# Patient Record
Sex: Male | Born: 1950 | Race: White | Hispanic: No | Smoking: Former smoker
Health system: Southern US, Community
[De-identification: ages and names within clinical notes are randomized; demographics above are authoritative.]

## PROBLEM LIST (undated history)

## (undated) DIAGNOSIS — Z91041 Radiographic dye allergy status: Secondary | ICD-10-CM

## (undated) DIAGNOSIS — I639 Cerebral infarction, unspecified: Secondary | ICD-10-CM

## (undated) DIAGNOSIS — I469 Cardiac arrest, cause unspecified: Secondary | ICD-10-CM

## (undated) DIAGNOSIS — I10 Essential (primary) hypertension: Secondary | ICD-10-CM

## (undated) DIAGNOSIS — E119 Type 2 diabetes mellitus without complications: Secondary | ICD-10-CM

## (undated) HISTORY — PX: FRACTURE SURGERY: SHX138

## (undated) HISTORY — PX: APPENDECTOMY: SHX54

## (undated) HISTORY — PX: JOINT REPLACEMENT: SHX530

---

## 2015-03-22 ENCOUNTER — Encounter (HOSPITAL_COMMUNITY): Payer: Self-pay

## 2015-03-22 ENCOUNTER — Emergency Department (HOSPITAL_COMMUNITY)
Admission: EM | Admit: 2015-03-22 | Discharge: 2015-03-22 | Disposition: A | Discharge status: Home / Self Care | Payer: Medicare PPO | Attending: Emergency Medicine | Admitting: Emergency Medicine

## 2015-03-22 ENCOUNTER — Emergency Department (HOSPITAL_COMMUNITY): Payer: Medicare PPO

## 2015-03-22 DIAGNOSIS — I10 Essential (primary) hypertension: Secondary | ICD-10-CM | POA: Insufficient documentation

## 2015-03-22 DIAGNOSIS — Z8673 Personal history of transient ischemic attack (TIA), and cerebral infarction without residual deficits: Secondary | ICD-10-CM | POA: Insufficient documentation

## 2015-03-22 DIAGNOSIS — R110 Nausea: Secondary | ICD-10-CM | POA: Diagnosis not present

## 2015-03-22 DIAGNOSIS — Z87891 Personal history of nicotine dependence: Secondary | ICD-10-CM | POA: Diagnosis not present

## 2015-03-22 DIAGNOSIS — R1013 Epigastric pain: Secondary | ICD-10-CM

## 2015-03-22 DIAGNOSIS — Z792 Long term (current) use of antibiotics: Secondary | ICD-10-CM | POA: Diagnosis not present

## 2015-03-22 DIAGNOSIS — Z9049 Acquired absence of other specified parts of digestive tract: Secondary | ICD-10-CM | POA: Insufficient documentation

## 2015-03-22 DIAGNOSIS — Z79899 Other long term (current) drug therapy: Secondary | ICD-10-CM | POA: Diagnosis not present

## 2015-03-22 DIAGNOSIS — R1011 Right upper quadrant pain: Secondary | ICD-10-CM | POA: Insufficient documentation

## 2015-03-22 HISTORY — DX: Cerebral infarction, unspecified: I63.9

## 2015-03-22 HISTORY — DX: Essential (primary) hypertension: I10

## 2015-03-22 LAB — CBC WITH DIFFERENTIAL/PLATELET
BASOS ABS: 0 10*3/uL (ref 0.0–0.1)
BASOS PCT: 0 %
Eosinophils Absolute: 0.2 10*3/uL (ref 0.0–0.7)
Eosinophils Relative: 3 %
HEMATOCRIT: 41 % (ref 39.0–52.0)
Hemoglobin: 13.2 g/dL (ref 13.0–17.0)
Lymphocytes Relative: 26 %
Lymphs Abs: 1.8 10*3/uL (ref 0.7–4.0)
MCH: 28.1 pg (ref 26.0–34.0)
MCHC: 32.2 g/dL (ref 30.0–36.0)
MCV: 87.2 fL (ref 78.0–100.0)
MONO ABS: 0.5 10*3/uL (ref 0.1–1.0)
Monocytes Relative: 7 %
NEUTROS ABS: 4.6 10*3/uL (ref 1.7–7.7)
NEUTROS PCT: 65 %
Platelets: 117 10*3/uL — ABNORMAL LOW (ref 150–400)
RBC: 4.7 MIL/uL (ref 4.22–5.81)
RDW: 14.4 % (ref 11.5–15.5)
WBC: 7 10*3/uL (ref 4.0–10.5)

## 2015-03-22 LAB — COMPREHENSIVE METABOLIC PANEL
ALBUMIN: 3.9 g/dL (ref 3.5–5.0)
ALT: 132 U/L — ABNORMAL HIGH (ref 17–63)
AST: 57 U/L — AB (ref 15–41)
Alkaline Phosphatase: 139 U/L — ABNORMAL HIGH (ref 38–126)
Anion gap: 7 (ref 5–15)
BILIRUBIN TOTAL: 0.7 mg/dL (ref 0.3–1.2)
BUN: 10 mg/dL (ref 6–20)
CHLORIDE: 102 mmol/L (ref 101–111)
CO2: 26 mmol/L (ref 22–32)
Calcium: 8.4 mg/dL — ABNORMAL LOW (ref 8.9–10.3)
Creatinine, Ser: 0.71 mg/dL (ref 0.61–1.24)
GFR calc Af Amer: >60 mL/min (ref >60)
GFR calc non Af Amer: >60 mL/min (ref >60)
GLUCOSE: 204 mg/dL — AB (ref 65–99)
POTASSIUM: 3.8 mmol/L (ref 3.5–5.1)
Sodium: 135 mmol/L (ref 135–145)
Total Protein: 7.3 g/dL (ref 6.5–8.1)

## 2015-03-22 LAB — LIPASE, BLOOD: Lipase: 16 U/L — ABNORMAL LOW (ref 22–51)

## 2015-03-22 MED ORDER — ONDANSETRON HCL 4 MG/2ML IJ SOLN
4.0000 mg | Freq: Once | INTRAMUSCULAR | Status: AC
  Administered 2015-03-22: 4 mg via INTRAVENOUS
  Filled 2015-03-22: qty 2

## 2015-03-22 MED ORDER — SODIUM CHLORIDE 0.9 % IV SOLN
1000.0000 mL | Freq: Once | INTRAVENOUS | Status: AC
  Administered 2015-03-22: 1000 mL via INTRAVENOUS

## 2015-03-22 MED ORDER — TRAMADOL HCL 50 MG PO TABS: 50.0000 mg | ORAL_TABLET | Freq: Four times a day (QID) | ORAL | Status: DC | PRN

## 2015-03-22 MED ORDER — SENNOSIDES-DOCUSATE SODIUM 8.6-50 MG PO TABS: 2.0000 | ORAL_TABLET | Freq: Every day | ORAL | Status: DC

## 2015-03-22 NOTE — ED Notes (Signed)
Abdominal pain started 2 days ago. Pt complains of no BM since Monday evening. No N/V/D

## 2015-03-22 NOTE — Discharge Instructions (Signed)
As discussed, your evaluation today has been largely reassuring.  But, it is important that you monitor your condition carefully, and do not hesitate to return to the ED if you develop new, or concerning changes in your condition.  Your pain is likely due to the resolving inflammation in your liver and gall bladder.  Please follow-up with your physicians in [place] for appropriate ongoing care.

## 2015-03-22 NOTE — ED Provider Notes (Signed)
CSN: [id number]     Arrival date & time 03/22/15  1158 History   First MD Initiated Contact with Patient 03/22/15 1216     Chief Complaint  Patient presents with  . Abdominal Pain     (Consider location/radiation/quality/duration/timing/severity/associated sxs/prior Treatment) HPI Patient is also concerned abdominal pain. This episode began earlier today, without clear precipitant. Has been focally in the epigastrium, sore, severe. There is associated nausea, no vomiting. Notably, the patient had similar pain 4 days ago, with vomiting. He was evaluated at a facility in [place], admitted for 3 days, had evaluation including CT scan, scan, ultrasound. Patient was told he had cholecystitis. Patient had no cholecystectomy, was discharged with antibiotics, yesterday, pain free.   Past Medical History  Diagnosis Date  . Stroke   . Hypertension    Past Surgical History  Procedure Laterality Date  . Appendectomy    . Joint replacement    . Fracture surgery     No family history on file. Social History  Substance Use Topics  . Smoking status: Former Games developer  . Smokeless tobacco: None  . Alcohol Use: No    Review of Systems  Constitutional:       Per HPI, otherwise negative  HENT:       Per HPI, otherwise negative  Respiratory:       Per HPI, otherwise negative  Cardiovascular:       Per HPI, otherwise negative  Gastrointestinal: Positive for nausea and abdominal pain. Negative for vomiting.  Endocrine:       Negative aside from HPI  Genitourinary:       Neg aside from HPI   Musculoskeletal:       Per HPI, otherwise negative  Skin: Negative.   Neurological: Negative for syncope.      Allergies  Bee venom; Contrast media; and Naproxen  Home Medications   Prior to Admission medications   Medication Sig Start Date End Date Taking? Authorizing Provider  CARTIA XT 120 MG 24 hr capsule Take 1 capsule by mouth daily. 02/27/15  Yes Historical Provider, MD   ciprofloxacin (CIPRO) 500 MG tablet Take 1 tablet by mouth 2 (two) times daily. 03/21/15  Yes Historical Provider, MD   BP 159/85 mmHg  Pulse 89  Temp(Src) 98.1 F (36.7 C) (Oral)  Resp 18  Ht  (1.727 m)  Wt 206 lb (93.441 kg)  BMI 31.33 kg/m2  SpO2 99% Physical Exam  Constitutional: He is oriented to person, place, and time. He appears well-developed. No distress.  HENT:  Head: Normocephalic and atraumatic.  Eyes: Conjunctivae and EOM are normal.  Cardiovascular: Normal rate and regular rhythm.   Pulmonary/Chest: Effort normal. No stridor. No respiratory distress.  Abdominal: He exhibits no distension. There is tenderness in the right upper quadrant and epigastric area.  Musculoskeletal: He exhibits no edema.  Neurological: He is alert and oriented to person, place, and time.  Skin: Skin is warm and dry.  Psychiatric: He has a normal mood and affect.  Nursing note and vitals reviewed.   ED Course  Procedures (including critical care time) Labs Review Labs Reviewed  CBC WITH DIFFERENTIAL/PLATELET - Abnormal; Notable for the following:    Platelets 117 (*)    All other components within normal limits  COMPREHENSIVE METABOLIC PANEL - Abnormal; Notable for the following:    Glucose, Bld 204 (*)    Calcium 8.4 (*)    AST 57 (*)    ALT 132 (*)    Alkaline Phosphatase  139 (*)    All other components within normal limits  LIPASE, BLOOD - Abnormal; Notable for the following:    Lipase 16 (*)    All other components within normal limits    Imaging Review US Abdomen Limited  03/22/2015   CLINICAL DATA:  Right upper quadrant and epigastric pain for 2 days.  EXAM: US ABDOMEN LIMITED - RIGHT UPPER QUADRANT  COMPARISON:  None.  FINDINGS: Gallbladder:  No gallstones or wall thickening visualized. No sonographic Murphy sign noted.  Common bile duct:  Diameter: 0.6 cm  Liver:  Echogenicity is increased consistent with fatty infiltration. No focal liver lesion. Right renal cysts  are incidentally noted.  IMPRESSION: Negative for gallstones.  No acute finding.  Fatty infiltration of the liver.   Electronically Signed   By: Drusilla Kanner M.D.   On: 03/22/2015 14:22   I have personally reviewed and evaluated these images anThis generally well-appearingd lab results as part of my medical decision-making.  A review of the patient's paperwork from [place] demonstrates diagnosis of cholecystitis.  Eval included labs, CT, HIDA.   On repeat exam the patient is calm, in no distress. We discussed all findings, compared to today's results with those from discharge yesterday, including improved liver function panel, lipase. Patient will follow up with either his gastroenterologist  In [place], or our local practitioner.  MDM    This generally well appearing M presents after recent hospitalization for abdominal pain, resulting in a diagnosis of cholecystitis. He patient is awake and alert, with a non-peritoneal abdomen, both upper abdominal pain concerning for recurrent hepatobiliary dysfunction. Patient's labs, ultrasound, all reassuring, improved from recent hospitalization. Patient was discharged with analgesia, stool softener, outpatient gastroenterology follow-up.   Gerhard Munch, MD 03/22/15 1600

## 2016-01-28 ENCOUNTER — Emergency Department (HOSPITAL_COMMUNITY): Payer: Medicare PPO

## 2016-01-28 ENCOUNTER — Emergency Department (HOSPITAL_COMMUNITY)
Admission: EM | Admit: 2016-01-28 | Discharge: 2016-01-28 | Disposition: A | Discharge status: Home / Self Care | Payer: Medicare PPO | Attending: Emergency Medicine | Admitting: Emergency Medicine

## 2016-01-28 ENCOUNTER — Encounter (HOSPITAL_COMMUNITY): Payer: Self-pay | Admitting: Emergency Medicine

## 2016-01-28 DIAGNOSIS — S79922A Unspecified injury of left thigh, initial encounter: Secondary | ICD-10-CM | POA: Diagnosis present

## 2016-01-28 DIAGNOSIS — Z79899 Other long term (current) drug therapy: Secondary | ICD-10-CM | POA: Insufficient documentation

## 2016-01-28 DIAGNOSIS — W051XXA Fall from non-moving nonmotorized scooter, initial encounter: Secondary | ICD-10-CM | POA: Insufficient documentation

## 2016-01-28 DIAGNOSIS — Y999 Unspecified external cause status: Secondary | ICD-10-CM | POA: Insufficient documentation

## 2016-01-28 DIAGNOSIS — Z87891 Personal history of nicotine dependence: Secondary | ICD-10-CM | POA: Diagnosis not present

## 2016-01-28 DIAGNOSIS — I10 Essential (primary) hypertension: Secondary | ICD-10-CM | POA: Insufficient documentation

## 2016-01-28 DIAGNOSIS — Y939 Activity, unspecified: Secondary | ICD-10-CM | POA: Insufficient documentation

## 2016-01-28 DIAGNOSIS — Y929 Unspecified place or not applicable: Secondary | ICD-10-CM | POA: Diagnosis not present

## 2016-01-28 DIAGNOSIS — S7012XA Contusion of left thigh, initial encounter: Secondary | ICD-10-CM
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
## 2016-04-13 ENCOUNTER — Encounter (HOSPITAL_COMMUNITY): Payer: Self-pay | Admitting: Emergency Medicine

## 2016-04-13 ENCOUNTER — Emergency Department (HOSPITAL_COMMUNITY): Payer: Medicare PPO

## 2016-04-13 ENCOUNTER — Emergency Department (HOSPITAL_COMMUNITY)
Admission: EM | Admit: 2016-04-13 | Discharge: 2016-04-13 | Disposition: A | Discharge status: Home / Self Care | Payer: Medicare PPO | Attending: Emergency Medicine | Admitting: Emergency Medicine

## 2016-04-13 DIAGNOSIS — I10 Essential (primary) hypertension: Secondary | ICD-10-CM | POA: Diagnosis not present

## 2016-04-13 DIAGNOSIS — R51 Headache: Secondary | ICD-10-CM | POA: Insufficient documentation

## 2016-04-13 DIAGNOSIS — Z79899 Other long term (current) drug therapy: Secondary | ICD-10-CM | POA: Insufficient documentation

## 2016-04-13 DIAGNOSIS — Z5181 Encounter for therapeutic drug level monitoring: Secondary | ICD-10-CM | POA: Insufficient documentation

## 2016-04-13 DIAGNOSIS — Z87891 Personal history of nicotine dependence: Secondary | ICD-10-CM | POA: Insufficient documentation

## 2016-04-13 DIAGNOSIS — R519 Headache, unspecified: Secondary | ICD-10-CM

## 2016-04-13 LAB — RAPID URINE DRUG SCREEN, HOSP PERFORMED
AMPHETAMINES: NOT DETECTED
Barbiturates: NOT DETECTED
Benzodiazepines: NOT DETECTED
Cocaine: NOT DETECTED
Opiates: NOT DETECTED
TETRAHYDROCANNABINOL: NOT DETECTED

## 2016-04-13 LAB — I-STAT TROPONIN, ED: Troponin i, poc: 0 ng/mL (ref 0.00–0.08)

## 2016-04-13 LAB — COMPREHENSIVE METABOLIC PANEL
ALK PHOS: 78 U/L (ref 38–126)
ALT: 21 U/L (ref 17–63)
AST: 18 U/L (ref 15–41)
Albumin: 4.2 g/dL (ref 3.5–5.0)
Anion gap: 7 (ref 5–15)
BUN: 9 mg/dL (ref 6–20)
CALCIUM: 9.1 mg/dL (ref 8.9–10.3)
CO2: 30 mmol/L (ref 22–32)
CREATININE: 0.67 mg/dL (ref 0.61–1.24)
Chloride: 100 mmol/L — ABNORMAL LOW (ref 101–111)
GFR calc non Af Amer: >60 mL/min (ref >60)
GLUCOSE: 211 mg/dL — AB (ref 65–99)
Potassium: 4 mmol/L (ref 3.5–5.1)
SODIUM: 137 mmol/L (ref 135–145)
Total Protein: 7.6 g/dL (ref 6.5–8.1)

## 2016-04-13 LAB — CBC
HEMATOCRIT: 41.9 % (ref 39.0–52.0)
HEMOGLOBIN: 13.5 g/dL (ref 13.0–17.0)
MCH: 28.1 pg (ref 26.0–34.0)
MCHC: 32.2 g/dL (ref 30.0–36.0)
MCV: 87.1 fL (ref 78.0–100.0)
PLATELETS: 134 10*3/uL — AB (ref 150–400)
RBC: 4.81 MIL/uL (ref 4.22–5.81)
RDW: 13.5 % (ref 11.5–15.5)
WBC: 8.9 10*3/uL (ref 4.0–10.5)

## 2016-04-13 LAB — DIFFERENTIAL
Basophils Absolute: 0 10*3/uL (ref 0.0–0.1)
Basophils Relative: 0 %
Eosinophils Absolute: 0.2 10*3/uL (ref 0.0–0.7)
Eosinophils Relative: 2 %
LYMPHS ABS: 2.9 10*3/uL (ref 0.7–4.0)
LYMPHS PCT: 32 %
MONO ABS: 0.8 10*3/uL (ref 0.1–1.0)
MONOS PCT: 8 %
NEUTROS ABS: 5.1 10*3/uL (ref 1.7–7.7)
Neutrophils Relative %: 58 %

## 2016-04-13 LAB — APTT: aPTT: 29 seconds (ref 24–36)

## 2016-04-13 LAB — I-STAT CHEM 8, ED
BUN: 8 mg/dL (ref 6–20)
CALCIUM ION: 1.17 mmol/L (ref 1.15–1.40)
CHLORIDE: 98 mmol/L — AB (ref 101–111)
CREATININE: 0.7 mg/dL (ref 0.61–1.24)
GLUCOSE: 209 mg/dL — AB (ref 65–99)
HCT: 41 % (ref 39.0–52.0)
Hemoglobin: 13.9 g/dL (ref 13.0–17.0)
Potassium: 4.3 mmol/L (ref 3.5–5.1)
Sodium: 140 mmol/L (ref 135–145)
TCO2: 31 mmol/L (ref 0–100)

## 2016-04-13 LAB — URINALYSIS, ROUTINE W REFLEX MICROSCOPIC
BILIRUBIN URINE: NEGATIVE
Glucose, UA: NEGATIVE mg/dL
Hgb urine dipstick: NEGATIVE
KETONES UR: NEGATIVE mg/dL
Leukocytes, UA: NEGATIVE
NITRITE: NEGATIVE
Protein, ur: NEGATIVE mg/dL
Specific Gravity, Urine: 1.01 (ref 1.005–1.030)
pH: 8.5 — ABNORMAL HIGH (ref 5.0–8.0)

## 2016-04-13 LAB — PROTIME-INR
INR: 1
Prothrombin Time: 13.2 seconds (ref 11.4–15.2)

## 2016-04-13 LAB — ETHANOL: Alcohol, Ethyl (B): 7 mg/dL — ABNORMAL HIGH (ref <5)

## 2016-04-13 MED ORDER — ACETAMINOPHEN 325 MG PO TABS
650.0000 mg | ORAL_TABLET | Freq: Once | ORAL | Status: DC
Start: 2016-04-13 — End: 2016-04-13

## 2016-04-13 MED ORDER — TRAMADOL HCL 50 MG PO TABS
50.0000 mg | ORAL_TABLET | Freq: Once | ORAL | Status: DC
  Filled 2016-04-13: qty 1

## 2016-04-13 MED ORDER — ACETAMINOPHEN 325 MG PO TABS
650.0000 mg | ORAL_TABLET | Freq: Once | ORAL | Status: AC
  Administered 2016-04-13: 650 mg via ORAL
  Filled 2016-04-13: qty 2

## 2016-04-13 NOTE — ED Triage Notes (Signed)
Pt reports headache and elevated BP that started yesterday. Pt states his speech is slurred and he has weakness but is unsure if this is a new CVA or symtoms from his previous stroke.
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
## 2019-06-04 ENCOUNTER — Other Ambulatory Visit: Payer: Self-pay

## 2019-06-04 ENCOUNTER — Emergency Department (HOSPITAL_COMMUNITY): Payer: Medicare PPO

## 2019-06-04 ENCOUNTER — Encounter (HOSPITAL_COMMUNITY): Payer: Self-pay | Admitting: Emergency Medicine

## 2019-06-04 ENCOUNTER — Emergency Department (HOSPITAL_COMMUNITY)
Admission: EM | Admit: 2019-06-04 | Discharge: 2019-06-04 | Disposition: A | Discharge status: Home / Self Care | Payer: Medicare PPO | Attending: Emergency Medicine | Admitting: Emergency Medicine

## 2019-06-04 DIAGNOSIS — Y929 Unspecified place or not applicable: Secondary | ICD-10-CM | POA: Insufficient documentation

## 2019-06-04 DIAGNOSIS — Y939 Activity, unspecified: Secondary | ICD-10-CM | POA: Insufficient documentation

## 2019-06-04 DIAGNOSIS — I10 Essential (primary) hypertension: Secondary | ICD-10-CM | POA: Diagnosis not present

## 2019-06-04 DIAGNOSIS — M25511 Pain in right shoulder: Secondary | ICD-10-CM | POA: Insufficient documentation

## 2019-06-04 DIAGNOSIS — Z87891 Personal history of nicotine dependence: Secondary | ICD-10-CM | POA: Diagnosis not present

## 2019-06-04 DIAGNOSIS — Z79899 Other long term (current) drug therapy: Secondary | ICD-10-CM | POA: Diagnosis not present

## 2019-06-04 DIAGNOSIS — Y999 Unspecified external cause status: Secondary | ICD-10-CM | POA: Diagnosis not present

## 2019-06-04 DIAGNOSIS — W010XXA Fall on same level from slipping, tripping and stumbling without subsequent striking against object, initial encounter: Secondary | ICD-10-CM | POA: Insufficient documentation

## 2019-06-04 DIAGNOSIS — Z9660 Presence of unspecified orthopedic joint implant: Secondary | ICD-10-CM | POA: Diagnosis not present

## 2019-06-04 NOTE — ED Notes (Signed)
Pt cussing in hallway and refusing to let RN go over discharge instructions, pt states "I came here for pain medication and I didn't get shit, I'm tired of hurting." Pt handed discharge instructions and walked out without signing. Pt refused to sign.
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
## 2019-06-04 NOTE — Discharge Instructions (Addendum)
As discussed, nothing in your shoulder is broken. You have a little bit of arthritis in your right shoulder which could be causing your pain. I have included the number of the orthopedic doctor for further evaluation. Call on Monday to schedule an appointment. You may take over the counter Tylenol or Ibuprofen as needed for pain. You can buy over the counter Voltaren gel and rub over area of pain. Return to the ER for new or worsening pain.

## 2019-06-04 NOTE — ED Triage Notes (Signed)
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

## 2019-06-04 NOTE — ED Triage Notes (Signed)
Patient c/o right arm pain x3 weeks. Patient states "popping and cracking." Pain worse after fall x1 week ago. Denies hitting head or LOC. Patient states pain is worse with movement.

## 2019-06-04 NOTE — ED Notes (Signed)
Patient transported to X-ray 

## 2021-06-16 ENCOUNTER — Emergency Department (HOSPITAL_COMMUNITY): Payer: Medicare PPO

## 2021-06-16 ENCOUNTER — Emergency Department (HOSPITAL_COMMUNITY)
Admission: EM | Admit: 2021-06-16 | Discharge: 2021-06-16 | Disposition: A | Discharge status: Home / Self Care | Payer: Medicare PPO | Attending: Emergency Medicine | Admitting: Emergency Medicine

## 2021-06-16 DIAGNOSIS — S8992XA Unspecified injury of left lower leg, initial encounter: Secondary | ICD-10-CM | POA: Diagnosis present

## 2021-06-16 DIAGNOSIS — Z96649 Presence of unspecified artificial hip joint: Secondary | ICD-10-CM | POA: Insufficient documentation

## 2021-06-16 DIAGNOSIS — S8012XA Contusion of left lower leg, initial encounter: Secondary | ICD-10-CM | POA: Insufficient documentation

## 2021-06-16 DIAGNOSIS — Z79899 Other long term (current) drug therapy: Secondary | ICD-10-CM | POA: Diagnosis not present

## 2021-06-16 DIAGNOSIS — W19XXXA Unspecified fall, initial encounter: Secondary | ICD-10-CM | POA: Diagnosis not present

## 2021-06-16 DIAGNOSIS — I10 Essential (primary) hypertension: Secondary | ICD-10-CM | POA: Diagnosis not present

## 2021-06-16 DIAGNOSIS — J0100 Acute maxillary sinusitis, unspecified: Secondary | ICD-10-CM | POA: Insufficient documentation

## 2021-06-16 DIAGNOSIS — Z87891 Personal history of nicotine dependence: Secondary | ICD-10-CM | POA: Insufficient documentation

## 2021-06-16 MED ORDER — OXYCODONE-ACETAMINOPHEN 5-325 MG PO TABS
1.0000 | ORAL_TABLET | Freq: Once | ORAL | Status: AC
  Administered 2021-06-16: 15:00:00 1 via ORAL
  Filled 2021-06-16: qty 1

## 2021-06-16 MED ORDER — HYDROMORPHONE HCL 1 MG/ML IJ SOLN: 0.5000 mg | Freq: Once | INTRAMUSCULAR | Status: DC

## 2021-06-16 MED ORDER — AMOXICILLIN 500 MG PO CAPS: 500.0000 mg | ORAL_CAPSULE | Freq: Three times a day (TID) | ORAL | 0 refills | Status: DC

## 2021-06-16 MED ORDER — OXYCODONE-ACETAMINOPHEN 5-325 MG PO TABS: 1.0000 | ORAL_TABLET | Freq: Four times a day (QID) | ORAL | 0 refills | Status: DC | PRN

## 2021-06-16 NOTE — ED Triage Notes (Signed)
Fell yesterday, pain in left thigh area, also has nasal drainage into throat
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
## 2021-06-16 NOTE — ED Provider Notes (Signed)
[facility] EMERGENCY DEPARTMENT Provider Note   CSN: [id number] Arrival date & time: 06/16/21  1232     History Chief Complaint  Patient presents with   Leg Injury    Samuel Santos is a 70 y.o. male.  Patient states he fell today and hurt his left leg.  Patient points to the midshaft of his femur for pain.  Patient also has a few day history of sinus infection  The history is provided by the patient and medical records.  Fall This is a new problem. The current episode started 6 to 12 hours ago. The problem occurs rarely. The problem has not changed since onset.Pertinent negatives include no chest pain, no abdominal pain and no headaches. Exacerbated by: Standing. Nothing relieves the symptoms. He has tried nothing for the symptoms.      Past Medical History:  Diagnosis Date   Hypertension    Stroke [facility])     There are no problems to display for this patient.   Past Surgical History:  Procedure Laterality Date   APPENDECTOMY     FRACTURE SURGERY     JOINT REPLACEMENT         Family History  Problem Relation Age of Onset   Diabetes Father    Heart attack Other     Social History   Tobacco Use   Smoking status: Former    Types: E-cigarettes   Smokeless tobacco: Never  Vaping Use   Vaping Use: Never used  Substance Use Topics   Alcohol use: No   Drug use: No    Home Medications Prior to Admission medications   Medication Sig Start Date End Date Taking? Authorizing Provider  amoxicillin (AMOXIL) 500 MG capsule Take 1 capsule (500 mg total) by mouth 3 (three) times daily. 06/16/21  Yes Bethann Berkshire, MD  diltiazem [facility]) 120 MG 24 hr capsule Take by mouth. 03/01/19  Yes [provider]  glipiZIDE (GLUCOTROL) 5 MG tablet TAKE 1 TABLET TWICE DAILY  (NEED LABS FOR FURTHER REFILLS) 03/01/19  Yes [provider]  oxyCODONE-acetaminophen (PERCOCET) 5-325 MG tablet Take 1 tablet by mouth every 6 (six) hours as needed for up to 20 doses for  moderate pain. 06/16/21  Yes Bethann Berkshire, MD  atenolol (TENORMIN) 50 MG tablet Take 50 mg by mouth daily.    [provider]  diltiazem (CARDIZEM SR) 120 MG 12 hr capsule Take 120 mg by mouth daily. 03/06/16   [provider]    Allergies    Bee venom, Contrast media [iodinated contrast media], and Naproxen  Review of Systems   Review of Systems  Constitutional:  Negative for appetite change and fatigue.  HENT:  Negative for congestion, ear discharge and sinus pressure.        Congestion and sinus drainage  Eyes:  Negative for discharge.  Respiratory:  Negative for cough.   Cardiovascular:  Negative for chest pain.  Gastrointestinal:  Negative for abdominal pain and diarrhea.  Genitourinary:  Negative for frequency and hematuria.  Musculoskeletal: Negative.  Negative for back pain.       Pain midshaft femur left  Skin:  Negative for rash.  Neurological:  Negative for seizures and headaches.  Psychiatric/Behavioral:  Negative for hallucinations.    Physical Exam Updated Vital Signs BP (!) 169/92    Pulse 99    Temp 99.5 F (37.5 C) (Oral)    Resp 20    SpO2 96%   Physical Exam Vitals and nursing note reviewed.  Constitutional:  Appearance: He is well-developed.  HENT:     Head: Normocephalic.     Comments: Mild tenderness maxillary sinuses bilaterally    Nose: Nose normal.  Eyes:     General: No scleral icterus.    Conjunctiva/sclera: Conjunctivae normal.  Neck:     Thyroid: No thyromegaly.  Cardiovascular:     Rate and Rhythm: Normal rate and regular rhythm.     Heart sounds: No murmur heard.   No friction rub. No gallop.  Pulmonary:     Breath sounds: No stridor. No wheezing or rales.  Chest:     Chest wall: No tenderness.  Abdominal:     General: There is no distension.     Tenderness: There is no abdominal tenderness. There is no rebound.  Musculoskeletal:     Cervical back: Neck supple.     Comments: Tenderness midshaft lateral left  femur  Lymphadenopathy:     Cervical: No cervical adenopathy.  Skin:    Findings: No erythema or rash.  Neurological:     Mental Status: He is alert and oriented to person, place, and time.     Motor: No abnormal muscle tone.     Coordination: Coordination normal.  Psychiatric:        Behavior: Behavior normal.    ED Results / Procedures / Treatments   Labs (all labs ordered are listed, but only abnormal results are displayed) Labs Reviewed - No data to display  EKG None  Radiology CT FEMUR LEFT WO CONTRAST  Result Date: 06/16/2021 CLINICAL DATA:  Fall.  Left leg pain EXAM: CT OF THE LOWER LEFT EXTREMITY WITHOUT CONTRAST TECHNIQUE: Multidetector CT imaging of the lower left extremity was performed according to the standard protocol. COMPARISON:  X-ray 06/16/2021, 01/28/2016 FINDINGS: Bones/Joint/Cartilage Extensive postoperative changes to the left femur including left hip arthroplasty and left femoral diaphyseal ORIF hardware. Hardware components are intact. No perihardware fracture or lucency. Femur intact without fracture or malalignment. Degenerative changes of the left knee with chondrocalcinosis. No lytic or sclerotic bone lesion identified. Ligaments Suboptimally assessed by CT. Muscles and Tendons Musculotendinous structures appear intact by CT. Soft tissues No periprosthetic fluid collection. No soft tissue swelling or fluid collection. Atherosclerotic vascular calcifications. No left inguinal lymphadenopathy. IMPRESSION: 1. No acute osseous abnormality of the left femur. 2. Extensive postoperative changes to the left femur including left hip arthroplasty and left femoral diaphyseal ORIF hardware. Hardware components are intact. No periprosthetic fracture or lucency. Electronically Signed   By: Davina Poke D.O.   On: 06/16/2021 14:36   DG Hip Unilat W or Wo Pelvis 2-3 Views Left  Result Date: 06/16/2021 CLINICAL DATA:  fall EXAM: DG HIP (WITH OR WITHOUT PELVIS) 2-3V LEFT;  LEFT FEMUR 2 VIEWS COMPARISON:  January 28, 2016 FINDINGS: Osteopenia. Status post LEFT hip arthroplasty and ORIF of the LEFT femur. Orthopedic hardware is intact and without periprosthetic fracture or lucency. Remote lesser trochanter fracture, unchanged in comparison to prior. No definitive acute fracture or dislocation. Evaluation of the sacrum is limited due to overlapping bowel contents. Chondrocalcinosis. Mild age-appropriate degenerative changes of the knee. Vascular calcifications. Pelvic phleboliths. IMPRESSION: No acute fracture or dislocation. If persistent concern for nondisplaced hip or pelvic fracture, recommend dedicated pelvic MRI. Electronically Signed   By: Valentino Saxon M.D.   On: 06/16/2021 13:32   DG Femur Min 2 Views Left  Result Date: 06/16/2021 CLINICAL DATA:  fall EXAM: DG HIP (WITH OR WITHOUT PELVIS) 2-3V LEFT; LEFT FEMUR 2 VIEWS COMPARISON:  January 28, 2016 FINDINGS: Osteopenia. Status post LEFT hip arthroplasty and ORIF of the LEFT femur. Orthopedic hardware is intact and without periprosthetic fracture or lucency. Remote lesser trochanter fracture, unchanged in comparison to prior. No definitive acute fracture or dislocation. Evaluation of the sacrum is limited due to overlapping bowel contents. Chondrocalcinosis. Mild age-appropriate degenerative changes of the knee. Vascular calcifications. Pelvic phleboliths. IMPRESSION: No acute fracture or dislocation. If persistent concern for nondisplaced hip or pelvic fracture, recommend dedicated pelvic MRI. Electronically Signed   By: Valentino Saxon M.D.   On: 06/16/2021 13:32    Procedures Procedures   Medications Ordered in ED Medications  oxyCODONE-acetaminophen (PERCOCET/ROXICET) 5-325 MG per tablet 1 tablet (has no administration in time range)    ED Course  I have reviewed the triage vital signs and the nursing notes.  Pertinent labs & imaging results that were available during my care of the patient were reviewed  by me and considered in my medical decision making (see chart for details).    MDM Rules/Calculators/A&P                         Patient with a contusion to left femur.  Along with sinusitis he will be given pain medicine and and antibiotics and told to use a walker and follow-up with orthopedics    Final Clinical Impression(s) / ED Diagnoses Final diagnoses:  Contusion of left lower extremity, initial encounter  Subacute maxillary sinusitis    Rx / DC Orders ED Discharge Orders          Ordered    oxyCODONE-acetaminophen (PERCOCET) 5-325 MG tablet  Every 6 hours PRN        06/16/21 1443    amoxicillin (AMOXIL) 500 MG capsule  3 times daily        06/16/21 1443             Milton Ferguson, MD 06/16/21 1448

## 2021-06-16 NOTE — Discharge Instructions (Signed)
Use a walker to ambulate with and follow-up with your family doctor or Dr. Romeo Apple if not improving

## 2022-02-21 ENCOUNTER — Other Ambulatory Visit: Payer: Self-pay

## 2022-02-21 ENCOUNTER — Emergency Department (HOSPITAL_COMMUNITY)
Admission: EM | Admit: 2022-02-21 | Discharge: 2022-02-21 | Disposition: A | Discharge status: Home / Self Care | Payer: Medicare PPO | Attending: Emergency Medicine | Admitting: Emergency Medicine

## 2022-02-21 ENCOUNTER — Encounter (HOSPITAL_COMMUNITY): Payer: Self-pay | Admitting: Emergency Medicine

## 2022-02-21 ENCOUNTER — Emergency Department (HOSPITAL_COMMUNITY): Payer: Medicare PPO

## 2022-02-21 DIAGNOSIS — R0789 Other chest pain: Secondary | ICD-10-CM | POA: Insufficient documentation

## 2022-02-21 DIAGNOSIS — Z7984 Long term (current) use of oral hypoglycemic drugs: Secondary | ICD-10-CM | POA: Diagnosis not present

## 2022-02-21 DIAGNOSIS — R42 Dizziness and giddiness: Secondary | ICD-10-CM | POA: Diagnosis not present

## 2022-02-21 DIAGNOSIS — E119 Type 2 diabetes mellitus without complications: Secondary | ICD-10-CM | POA: Diagnosis not present

## 2022-02-21 DIAGNOSIS — R079 Chest pain, unspecified: Secondary | ICD-10-CM

## 2022-02-21 LAB — CBC
HCT: 45.5 % (ref 39.0–52.0)
Hemoglobin: 14.5 g/dL (ref 13.0–17.0)
MCH: 27.1 pg (ref 26.0–34.0)
MCHC: 31.9 g/dL (ref 30.0–36.0)
MCV: 85 fL (ref 80.0–100.0)
Platelets: 128 10*3/uL — ABNORMAL LOW (ref 150–400)
RBC: 5.35 MIL/uL (ref 4.22–5.81)
RDW: 14.6 % (ref 11.5–15.5)
WBC: 11 10*3/uL — ABNORMAL HIGH (ref 4.0–10.5)
nRBC: 0 % (ref 0.0–0.2)

## 2022-02-21 LAB — TROPONIN I (HIGH SENSITIVITY)
Troponin I (High Sensitivity): 20 ng/L — ABNORMAL HIGH (ref <18)
Troponin I (High Sensitivity): 22 ng/L — ABNORMAL HIGH (ref <18)

## 2022-02-21 LAB — LIPID PANEL
Cholesterol: 229 mg/dL — ABNORMAL HIGH (ref 0–200)
HDL: 42 mg/dL (ref >40)
LDL Cholesterol: 145 mg/dL — ABNORMAL HIGH (ref 0–99)
Total CHOL/HDL Ratio: 5.5 RATIO
Triglycerides: 211 mg/dL — ABNORMAL HIGH (ref <150)
VLDL: 42 mg/dL — ABNORMAL HIGH (ref 0–40)

## 2022-02-21 LAB — BASIC METABOLIC PANEL
Anion gap: 12 (ref 5–15)
BUN: 9 mg/dL (ref 8–23)
CO2: 24 mmol/L (ref 22–32)
Calcium: 8.9 mg/dL (ref 8.9–10.3)
Chloride: 95 mmol/L — ABNORMAL LOW (ref 98–111)
Creatinine, Ser: 0.76 mg/dL (ref 0.61–1.24)
GFR, Estimated: >60 mL/min (ref >60)
Glucose, Bld: 298 mg/dL — ABNORMAL HIGH (ref 70–99)
Potassium: 3.9 mmol/L (ref 3.5–5.1)
Sodium: 131 mmol/L — ABNORMAL LOW (ref 135–145)

## 2022-02-21 MED ORDER — NITROGLYCERIN 0.4 MG SL SUBL: 0.4000 mg | SUBLINGUAL_TABLET | SUBLINGUAL | 0 refills | Status: AC | PRN

## 2022-02-21 MED ORDER — NITROGLYCERIN 0.4 MG SL SUBL
0.4000 mg | SUBLINGUAL_TABLET | Freq: Once | SUBLINGUAL | Status: AC
  Administered 2022-02-21: 0.4 mg via SUBLINGUAL
  Filled 2022-02-21: qty 1

## 2022-02-21 MED ORDER — NITROGLYCERIN 0.4 MG SL SUBL
0.4000 mg | SUBLINGUAL_TABLET | SUBLINGUAL | Status: DC | PRN
  Administered 2022-02-21: 0.4 mg via SUBLINGUAL

## 2022-02-21 MED ORDER — ATENOLOL 50 MG PO TABS: 50.0000 mg | ORAL_TABLET | Freq: Every day | ORAL | 0 refills | Status: DC

## 2022-02-21 NOTE — ED Provider Notes (Signed)
  ED Course / MDM   Clinical Course as of 02/21/22 1633  Fri Feb 21, 2022  1350 Patient did a moderate relief of his chest pain after 2 several nitros, pain is currently 2 out of 10.  His initial troponin is 20.  He will need a repeat troponin.  However in this clinical setting, with his untreated high blood pressure as well as clearly untreated high cholesterol, and high blood sugars, he has several cardiac risk factors, and I recommended staying in the hospital.  He needs some time to consider this.  I will speak to cardiology in the meantime. [MT]  1525 Received sign out from Dr. Renaye Rakers pending repeat troponin. Patient with uncontrolled diabetes. Doesn't want to take metformin.  [WS]  1551 I spoke to Dr Rennis Golden from cardiology who advised a repeat Trop, if significantly elevating will contact cardiology again about admission.  Otherwise if the trop is flat, patient could be discharged home with close cardiology f/u.  The patient is quite adamant about wanting to go home, and is very picky about which medications he's willing to take at home.  No cholesterol meds or diabetes medicines apparently, but okay with restarting atenolol and SL nitro "as needed."  Allergies to NSAIDS/aspirin.  He and his family member at bedside understand if they are discharged he is HIGH RISK for serious cardio disease or medical complications, and need specialist follow up urgently.   [MT]  1552 Signed out to Dr Ashley Jacobs pending 2nd trop [MT]  1631 Discussed results with patient. Emphasized importance of medication compliance. He reports he has metformin and cholesterol medication at home but doesn't take it because he doesn't like it. Discussed cardiology referral. Medications sent by previous doctor. Will discharge patient to home. All questions answered. Patient comfortable with plan of discharge. Return precautions discussed with patient and specified on the after visit summary.  [WS]    Clinical Course User Index [MT]  Trifan, Kermit Balo, MD [WS] Lonell Grandchild, MD         Lonell Grandchild, MD 02/21/22 [phone]

## 2022-02-21 NOTE — ED Provider Notes (Signed)
[facility] EMERGENCY DEPARTMENT Provider Note   CSN: [id number] Arrival date & time: 02/21/22  1205     History  Chief Complaint  Patient presents with   Chest Pain    Samuel Santos is a 71 y.o. male with history of stroke, left upper arm weakness and contracture, high blood pressure, presented emerged department with chest pain.  Patient ports he woke up this morning with pain in his left lower chest, also central chest.  It does not radiate anywhere.  He says it feels like "discomfort".  It is not sharp.  He has never had this feeling before.  He says he feels different than heartburn or reflux.  He did have episodic lightheadedness at home.  Reports he drank 2 cups of coffee and initially felt better but now the symptoms have returned.  It is currently moderate intensity chest discomfort  He reports he does not take any medication, does not believe in taking medications.  He denies any history of coronary disease or MI that he is aware of.  He does not smoke.  HPI     Home Medications Prior to Admission medications   Medication Sig Start Date End Date Taking? Authorizing Provider  atenolol (TENORMIN) 50 MG tablet Take 1 tablet (50 mg total) by mouth daily. 02/21/22 03/23/22 Yes Verdell Kincannon, Kermit Balo, MD  nitroGLYCERIN (NITROSTAT) 0.4 MG SL tablet Place 1 tablet (0.4 mg total) under the tongue every 5 (five) minutes as needed for chest pain. Up to 3 doses per day 02/21/22  Yes Andrej Spagnoli, Kermit Balo, MD  amoxicillin (AMOXIL) 500 MG capsule Take 1 capsule (500 mg total) by mouth 3 (three) times daily. 06/16/21   Bethann Berkshire, MD  atenolol (TENORMIN) 50 MG tablet Take 50 mg by mouth daily.    [provider]  diltiazem (CARDIZEM SR) 120 MG 12 hr capsule Take 120 mg by mouth daily. 03/06/16   [provider]  diltiazem (TIAZAC) 120 MG 24 hr capsule Take by mouth. 03/01/19   [provider]  glipiZIDE (GLUCOTROL) 5 MG tablet TAKE 1 TABLET TWICE DAILY  (NEED LABS FOR  FURTHER REFILLS) 03/01/19   [provider]  oxyCODONE-acetaminophen (PERCOCET) 5-325 MG tablet Take 1 tablet by mouth every 6 (six) hours as needed for up to 20 doses for moderate pain. 06/16/21   Bethann Berkshire, MD      Allergies    Bee venom, Contrast media [iodinated contrast media], and Naproxen    Review of Systems   Review of Systems  Physical Exam Updated Vital Signs BP (!) 153/85   Pulse 92   Temp 98.1 F (36.7 C) (Oral)   Resp 19   Ht 5\' 8"  (1.727 m)   Wt 96.2 kg   SpO2 99%   BMI 32.23 kg/m  Physical Exam Constitutional:      General: He is not in acute distress. HENT:     Head: Normocephalic and atraumatic.  Eyes:     Conjunctiva/sclera: Conjunctivae normal.     Pupils: Pupils are equal, round, and reactive to light.  Cardiovascular:     Rate and Rhythm: Normal rate and regular rhythm.  Pulmonary:     Effort: Pulmonary effort is normal. No respiratory distress.  Abdominal:     General: There is no distension.     Tenderness: There is no abdominal tenderness.  Skin:    General: Skin is warm and dry.  Neurological:     General: No focal deficit present.  Mental Status: He is alert. Mental status is at baseline.     Comments: Contracture left upper arm  Psychiatric:        Mood and Affect: Mood normal.        Behavior: Behavior normal.     ED Results / Procedures / Treatments   Labs (all labs ordered are listed, but only abnormal results are displayed) Labs Reviewed  BASIC METABOLIC PANEL - Abnormal; Notable for the following components:      Result Value   Sodium 131 (*)    Chloride 95 (*)    Glucose, Bld 298 (*)    All other components within normal limits  CBC - Abnormal; Notable for the following components:   WBC 11.0 (*)    Platelets 128 (*)    All other components within normal limits  LIPID PANEL - Abnormal; Notable for the following components:   Cholesterol 229 (*)    Triglycerides 211 (*)    VLDL 42 (*)    LDL Cholesterol  145 (*)    All other components within normal limits  TROPONIN I (HIGH SENSITIVITY) - Abnormal; Notable for the following components:   Troponin I (High Sensitivity) 20 (*)    All other components within normal limits  TROPONIN I (HIGH SENSITIVITY) - Abnormal; Notable for the following components:   Troponin I (High Sensitivity) 22 (*)    All other components within normal limits    EKG EKG Interpretation  Date/Time:  Friday February 21 2022 12:17:33 EDT Ventricular Rate:  93 PR Interval:  124 QRS Duration: 84 QT Interval:  362 QTC Calculation: 450 R Axis:   49 Text Interpretation: Normal sinus rhythm Normal ECG When compared with ECG of 04-Jun-2019 13:20, Nonspecific T wave abnormality now evident in Lateral leads Confirmed by Octaviano Glow [phone]) on 02/21/2022 12:29:16 PM  Radiology DG Chest 2 View  Result Date: 02/21/2022 CLINICAL DATA:  Provided history: Chest pain. Additional history provided: Midsternal chest pain radiating to right side of neck. Nausea. EXAM: CHEST - 2 VIEW COMPARISON:  Report from chest radiograph 04/11/2014 (images unavailable). FINDINGS: Cardiomegaly with central pulmonary vascular congestion. No overt pulmonary edema or appreciable consolidation. No evidence of pleural effusion or pneumothorax. Degenerative changes of the spine and right acromioclavicular joint. IMPRESSION: Cardiomegaly with central pulmonary vascular congestion. No overt pulmonary edema or airspace consolidation. Electronically Signed   By: Kellie Simmering D.O.   On: 02/21/2022 13:07    Procedures Procedures    Medications Ordered in ED Medications  nitroGLYCERIN (NITROSTAT) SL tablet 0.4 mg (0.4 mg Sublingual Given 02/21/22 1310)  nitroGLYCERIN (NITROSTAT) SL tablet 0.4 mg (0.4 mg Sublingual Given 02/21/22 1301)    ED Course/ Medical Decision Making/ A&P Clinical Course as of 02/21/22 1709  Fri Feb 21, 2022  1350 Patient did a moderate relief of his chest pain after 2 several nitros,  pain is currently 2 out of 10.  His initial troponin is 20.  He will need a repeat troponin.  However in this clinical setting, with his untreated high blood pressure as well as clearly untreated high cholesterol, and high blood sugars, he has several cardiac risk factors, and I recommended staying in the hospital.  He needs some time to consider this.  I will speak to cardiology in the meantime. [MT]  1525 Received sign out from Dr. Langston Masker pending repeat troponin. Patient with uncontrolled diabetes. Doesn't want to take metformin.  [WS]  1551 I spoke to Dr Debara Pickett from cardiology who advised a  repeat Trop, if significantly elevating will contact cardiology again about admission.  Otherwise if the trop is flat, patient could be discharged home with close cardiology f/u.  The patient is quite adamant about wanting to go home, and is very picky about which medications he's willing to take at home.  No cholesterol meds or diabetes medicines apparently, but okay with restarting atenolol and SL nitro "as needed."  Allergies to NSAIDS/aspirin.  He and his family member at bedside understand if they are discharged he is HIGH RISK for serious cardio disease or medical complications, and need specialist follow up urgently.   [MT]  1552 Signed out to Dr Ashley Jacobs pending 2nd trop [MT]  1631 Discussed results with patient. Emphasized importance of medication compliance. He reports he has metformin and cholesterol medication at home but doesn't take it because he doesn't like it. Discussed cardiology referral. Medications sent by previous doctor. Will discharge patient to home. All questions answered. Patient comfortable with plan of discharge. Return precautions discussed with patient and specified on the after visit summary.  [WS]    Clinical Course User Index [MT] Bensen Chadderdon, Kermit Balo, MD [WS] Lonell Grandchild, MD                           Medical Decision Making Amount and/or Complexity of Data Reviewed Labs:  ordered. Radiology: ordered.  Risk Prescription drug management.   This patient presents to the ED with concern for chest pain. This involves an extensive number of treatment options, and is a complaint that carries with it a high risk of complications and morbidity.  The differential diagnosis includes ACS versus pneumonia versus reflux gastritis versus pneumothorax versus other  Co-morbidities that complicate the patient evaluation: History of cardiovascular risk factors including high blood pressure, potential diabetes, per his old medication list.  He is noncompliant with his medications.  Additional history obtained from family member at bedside   I ordered and personally interpreted labs.  The pertinent results include:  trop 20 -> 22.  Cholesterol elevated.  Glucose 298.  Hgb wnl.  I ordered imaging studies including x-ray of the chest I independently visualized and interpreted imaging which showed mild cardiomegaly, no focal infiltrate I agree with the radiologist interpretation  The patient was maintained on a cardiac monitor.  I personally viewed and interpreted the cardiac monitored which showed an underlying rhythm of: Sinus rhythm  Per my interpretation the patient's ECG shows sinus rhythm with no acute ischemic findings  I ordered medication including SL trial for chest pain. The patient has hives allergies to NSAIDs, avoiding aspirin at this time  I have reviewed the patients home medicines and have made adjustments as needed  Test Considered: doubt acute PE clinically - no tachycardia or hypoxia  After the interventions noted above, I reevaluated the patient and found that they have: improved  Social Determinants of Health:Patient advised about medical compliance and his untreated medical conditions all put him at risk for recurring CVA or MI.    Dispostion:  Hospitalization was considered given his cardiac risk factors.  However, in discussion with the  cardiologist, and after shared medical decision making, the patient has opted to go home with close outpatient f/u.  He understands that our workup today has NOT ruled out potentially serious heart disease, and return precautions provided.         Final Clinical Impression(s) / ED Diagnoses Final diagnoses:  Chest pain, unspecified type  Rx / DC Orders ED Discharge Orders          Ordered    Ambulatory referral to Cardiology       Comments: Chest pain CAD evaluation   02/21/22 1553    nitroGLYCERIN (NITROSTAT) 0.4 MG SL tablet  Every 5 min PRN        02/21/22 1554    atenolol (TENORMIN) 50 MG tablet  Daily        02/21/22 1554              Terald Sleeper, MD 02/21/22 1711

## 2022-02-21 NOTE — Discharge Instructions (Signed)
You were diagnosed with chest pain today.  This is a non-specific diagnosis, but your provider did not feel this was a life-threatening condition at this time.  Chest pain is a common presenting condition in the Emergency Department, and it is not uncommon for patients to leave without a specific diagnosis.  It is important to remember that your workup today was not a complete medical workup.  You may still have a serious medical attention that needs follow up care with a specialist. If your provider referred you to see a specialist, it is VERY important that you call to set up an appointment with them.    It is also important that you speak to your primary care provider in 1-2 days after this visit to the ER.  Your PCP may want to see you in the office, or else they may want you to follow up with the specialist.  SEEK IMMEDIATE MEDICAL ATTENTION IF:  You have severe chest pain, especially if the pain is crushing or pressure-like and spreads to the arms, back, neck, or jaw, or if you have sweating, nausea (feeling sick to your stomach), or shortness of breath. THIS IS AN EMERGENCY. Don't wait to see if the pain will go away. Get medical help at once. Call 911 or 0 (operator). DO NOT drive yourself to the hospital.   Your chest pain gets worse and does not go away with rest.  You have an attack of chest pain lasting longer than usual, despite rest and treatment with the medications your caregiver has prescribed.  You wake from sleep with chest pain or shortness of breath.  You feel dizzy or faint.  You have chest pain not typical of your usual pain for which you originally saw your caregiver.  

## 2022-02-21 NOTE — ED Notes (Signed)
Introduced self to patient, assessed pain. Currently 0/10

## 2022-02-21 NOTE — ED Triage Notes (Signed)
Midsternal chest pain started this morning at 4am radiates to right side of neck. Some nausea this morning but none at current time.

## 2022-03-12 LAB — LAB REPORT - SCANNED: EGFR: 81

## 2022-07-01 DIAGNOSIS — J9602 Acute respiratory failure with hypercapnia: Secondary | ICD-10-CM | POA: Diagnosis not present

## 2022-07-01 DIAGNOSIS — I639 Cerebral infarction, unspecified: Secondary | ICD-10-CM | POA: Diagnosis not present

## 2022-07-01 DIAGNOSIS — R1311 Dysphagia, oral phase: Secondary | ICD-10-CM | POA: Diagnosis not present

## 2022-07-08 DIAGNOSIS — I509 Heart failure, unspecified: Secondary | ICD-10-CM | POA: Diagnosis not present

## 2022-07-08 DIAGNOSIS — E119 Type 2 diabetes mellitus without complications: Secondary | ICD-10-CM | POA: Diagnosis not present

## 2022-07-08 DIAGNOSIS — I10 Essential (primary) hypertension: Secondary | ICD-10-CM | POA: Diagnosis not present

## 2022-07-08 DIAGNOSIS — I6930 Unspecified sequelae of cerebral infarction: Secondary | ICD-10-CM | POA: Diagnosis not present

## 2022-07-08 DIAGNOSIS — Z6828 Body mass index (BMI) 28.0-28.9, adult: Secondary | ICD-10-CM | POA: Diagnosis not present

## 2022-07-17 IMAGING — DX DG FEMUR 2+V*L*
4 series · 4 of 4 positions shown · non-contrast
Comparison: January 28, 2016

CLINICAL DATA: fall

EXAM:
DG HIP (WITH OR WITHOUT PELVIS) 2-3V LEFT; LEFT FEMUR 2 VIEWS

[femur ap (1 of 2)]
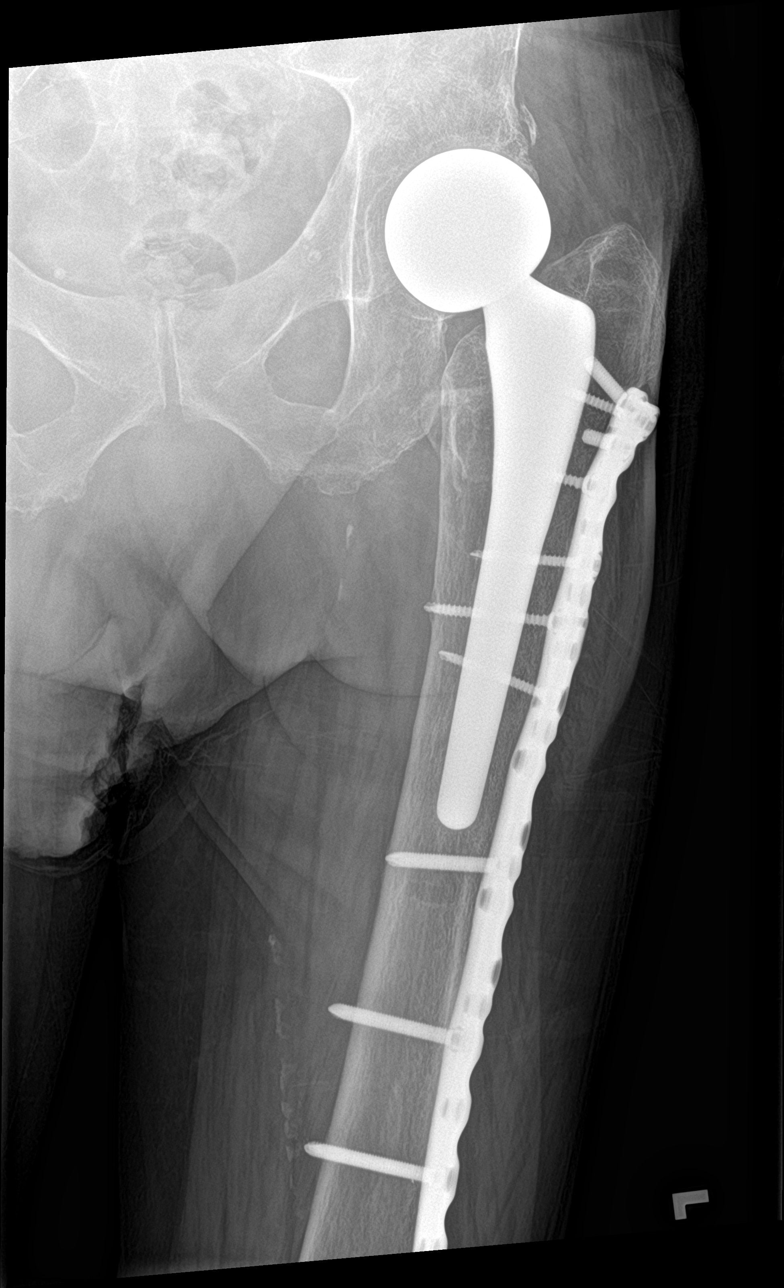

[femur ap (2 of 2)]
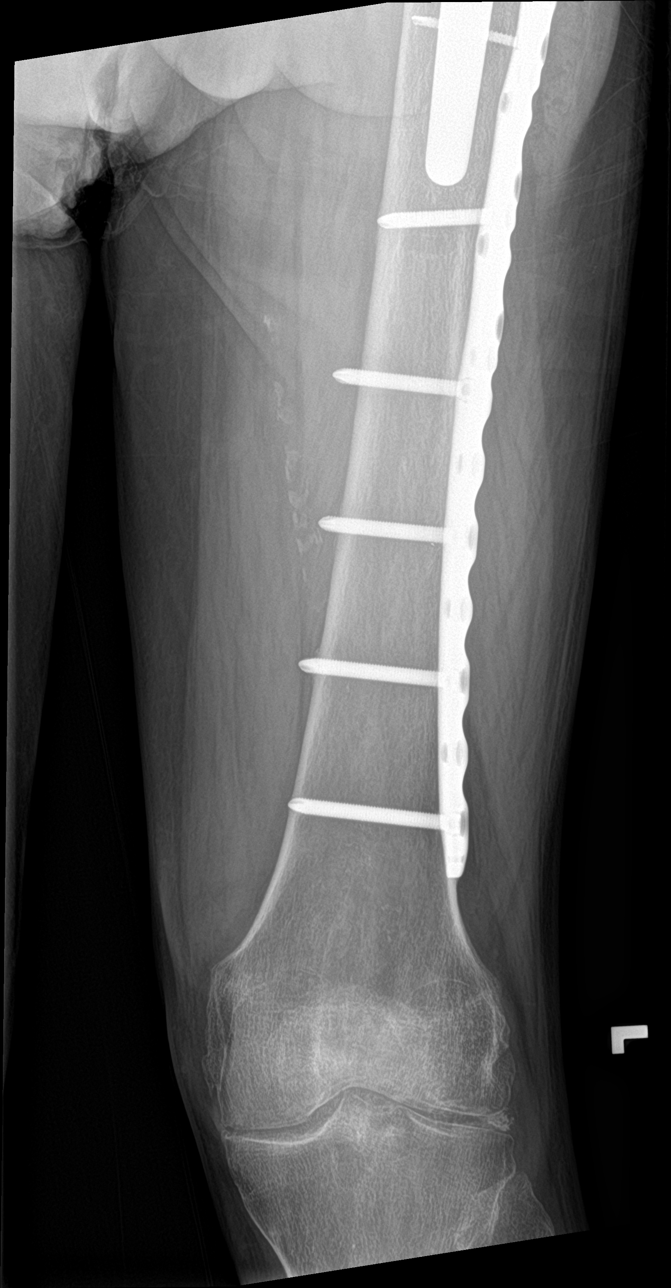

[femur lat (1 of 2)]
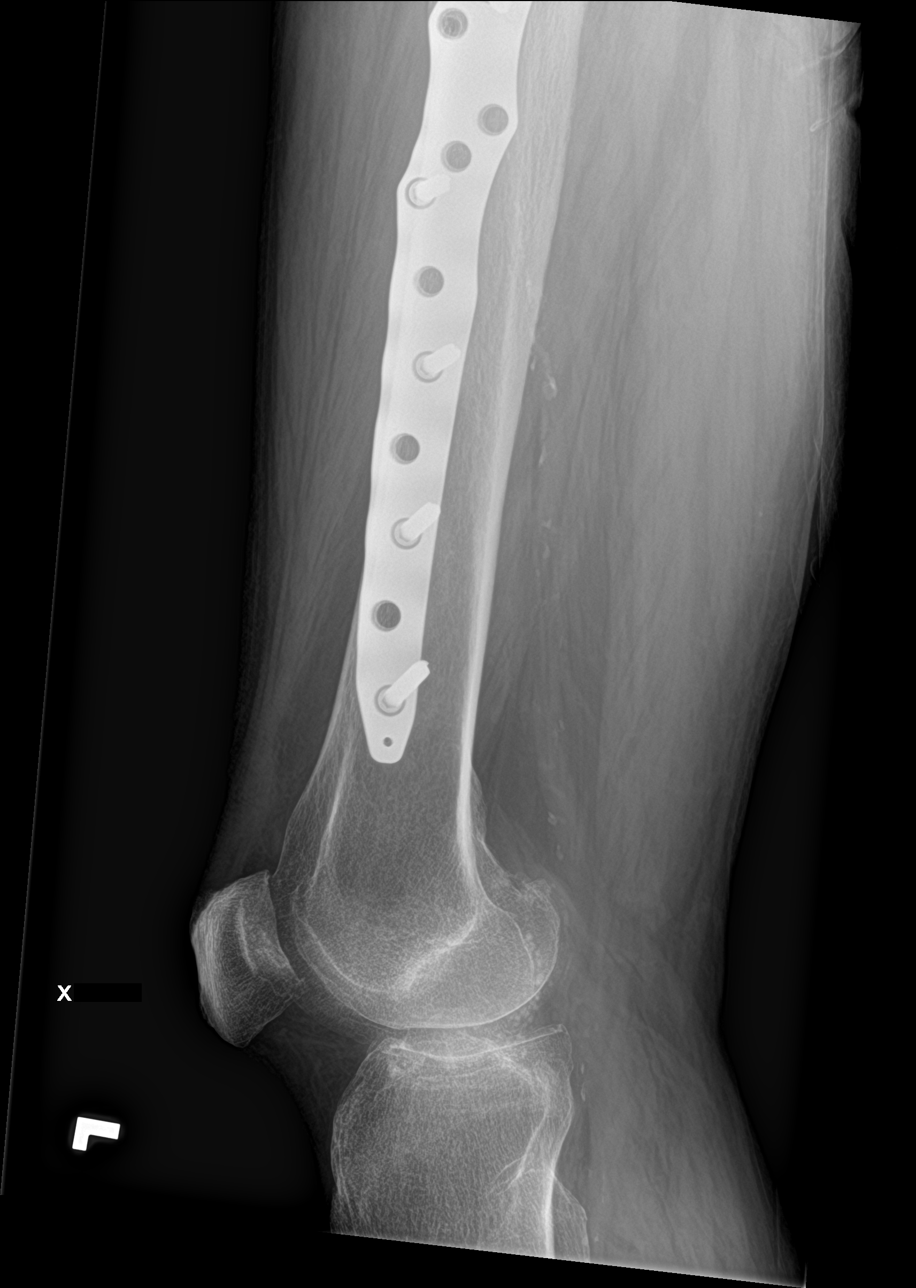

[femur lat (2 of 2)]
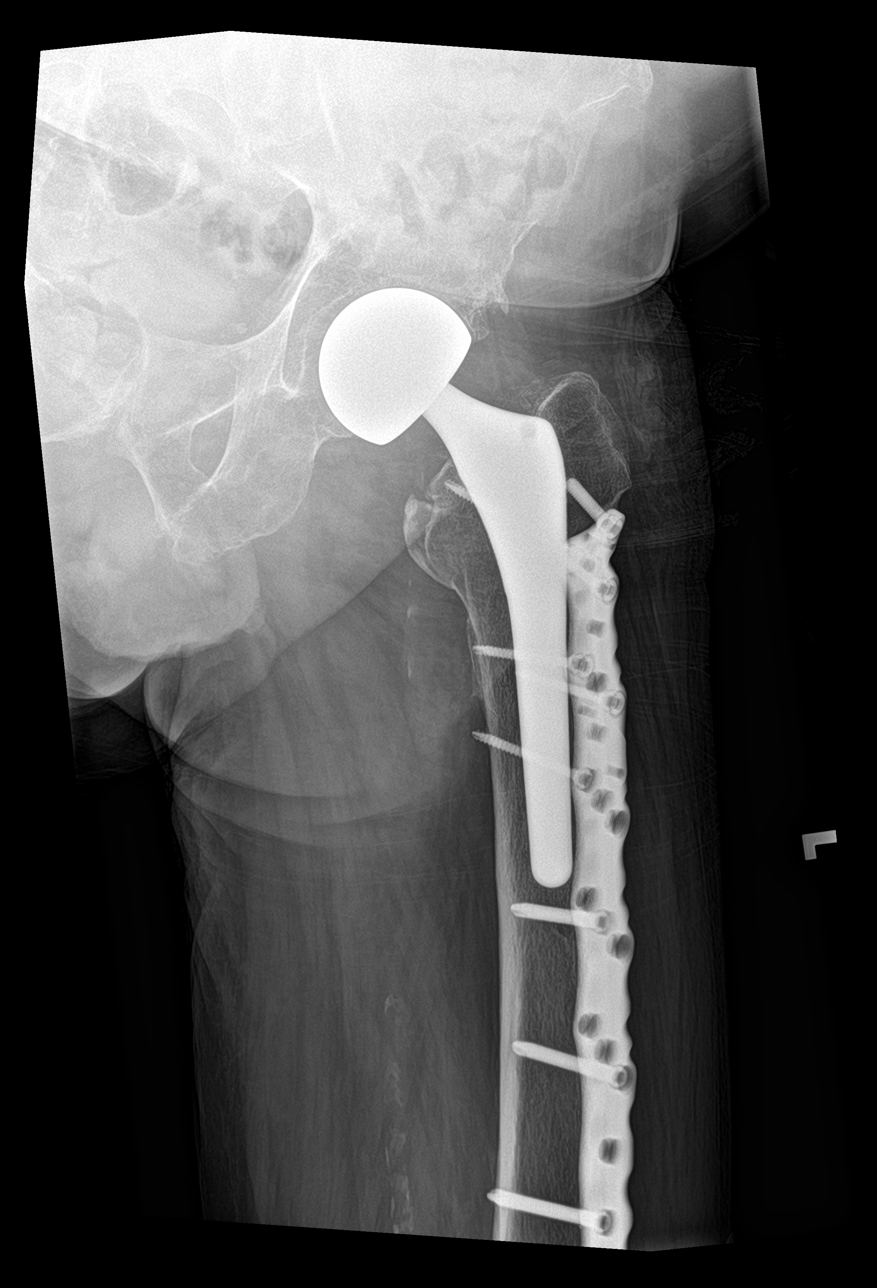

[4 of 4 positions shown; findings below may reference images not displayed]

FINDINGS: Osteopenia. Status post LEFT hip arthroplasty and ORIF of the LEFT
femur. Orthopedic hardware is intact and without periprosthetic
fracture or lucency. Remote lesser trochanter fracture, unchanged in
comparison to prior. No definitive acute fracture or dislocation.
Evaluation of the sacrum is limited due to overlapping bowel
contents. Chondrocalcinosis. Mild age-appropriate degenerative
changes of the knee. Vascular calcifications. Pelvic phleboliths.
IMPRESSION: No acute fracture or dislocation. If persistent concern for
nondisplaced hip or pelvic fracture, recommend dedicated pelvic MRI.

## 2022-07-22 DIAGNOSIS — R030 Elevated blood-pressure reading, without diagnosis of hypertension: Secondary | ICD-10-CM | POA: Diagnosis not present

## 2022-07-22 DIAGNOSIS — D485 Neoplasm of uncertain behavior of skin: Secondary | ICD-10-CM | POA: Diagnosis not present

## 2022-07-22 DIAGNOSIS — L989 Disorder of the skin and subcutaneous tissue, unspecified: Secondary | ICD-10-CM | POA: Diagnosis not present

## 2022-07-22 DIAGNOSIS — Z6828 Body mass index (BMI) 28.0-28.9, adult: Secondary | ICD-10-CM | POA: Diagnosis not present

## 2022-07-22 DIAGNOSIS — D224 Melanocytic nevi of scalp and neck: Secondary | ICD-10-CM | POA: Diagnosis not present

## 2022-08-01 DIAGNOSIS — R1311 Dysphagia, oral phase: Secondary | ICD-10-CM | POA: Diagnosis not present

## 2022-08-01 DIAGNOSIS — I639 Cerebral infarction, unspecified: Secondary | ICD-10-CM | POA: Diagnosis not present

## 2022-08-01 DIAGNOSIS — J9602 Acute respiratory failure with hypercapnia: Secondary | ICD-10-CM | POA: Diagnosis not present

## 2022-08-30 DIAGNOSIS — J9602 Acute respiratory failure with hypercapnia: Secondary | ICD-10-CM | POA: Diagnosis not present

## 2022-08-30 DIAGNOSIS — I639 Cerebral infarction, unspecified: Secondary | ICD-10-CM | POA: Diagnosis not present

## 2022-08-30 DIAGNOSIS — R1311 Dysphagia, oral phase: Secondary | ICD-10-CM | POA: Diagnosis not present

## 2022-09-30 DIAGNOSIS — J9602 Acute respiratory failure with hypercapnia: Secondary | ICD-10-CM | POA: Diagnosis not present

## 2022-09-30 DIAGNOSIS — I639 Cerebral infarction, unspecified: Secondary | ICD-10-CM | POA: Diagnosis not present

## 2022-09-30 DIAGNOSIS — R1311 Dysphagia, oral phase: Secondary | ICD-10-CM | POA: Diagnosis not present

## 2022-10-10 ENCOUNTER — Emergency Department (HOSPITAL_COMMUNITY): Payer: Medicare PPO

## 2022-10-10 ENCOUNTER — Other Ambulatory Visit: Payer: Self-pay

## 2022-10-10 ENCOUNTER — Encounter (HOSPITAL_COMMUNITY): Payer: Self-pay | Admitting: *Deleted

## 2022-10-10 ENCOUNTER — Inpatient Hospital Stay (HOSPITAL_COMMUNITY)
Admission: EM | Admit: 2022-10-10 | Discharge: 2022-10-14 | DRG: 291 | Disposition: A | Discharge status: Home Health | Payer: Medicare PPO | Attending: Internal Medicine | Admitting: Internal Medicine

## 2022-10-10 DIAGNOSIS — I272 Pulmonary hypertension, unspecified: Secondary | ICD-10-CM | POA: Diagnosis not present

## 2022-10-10 DIAGNOSIS — I5023 Acute on chronic systolic (congestive) heart failure: Secondary | ICD-10-CM | POA: Diagnosis not present

## 2022-10-10 DIAGNOSIS — I5A Non-ischemic myocardial injury (non-traumatic): Secondary | ICD-10-CM | POA: Diagnosis not present

## 2022-10-10 DIAGNOSIS — Z91041 Radiographic dye allergy status: Secondary | ICD-10-CM | POA: Diagnosis not present

## 2022-10-10 DIAGNOSIS — Z9660 Presence of unspecified orthopedic joint implant: Secondary | ICD-10-CM | POA: Diagnosis present

## 2022-10-10 DIAGNOSIS — Z794 Long term (current) use of insulin: Secondary | ICD-10-CM

## 2022-10-10 DIAGNOSIS — Z8249 Family history of ischemic heart disease and other diseases of the circulatory system: Secondary | ICD-10-CM

## 2022-10-10 DIAGNOSIS — R079 Chest pain, unspecified: Secondary | ICD-10-CM | POA: Diagnosis not present

## 2022-10-10 DIAGNOSIS — Z833 Family history of diabetes mellitus: Secondary | ICD-10-CM | POA: Diagnosis not present

## 2022-10-10 DIAGNOSIS — R0789 Other chest pain: Secondary | ICD-10-CM | POA: Diagnosis not present

## 2022-10-10 DIAGNOSIS — Z91030 Bee allergy status: Secondary | ICD-10-CM | POA: Diagnosis not present

## 2022-10-10 DIAGNOSIS — Z7984 Long term (current) use of oral hypoglycemic drugs: Secondary | ICD-10-CM | POA: Diagnosis not present

## 2022-10-10 DIAGNOSIS — E785 Hyperlipidemia, unspecified: Secondary | ICD-10-CM | POA: Diagnosis present

## 2022-10-10 DIAGNOSIS — Z6841 Body Mass Index (BMI) 40.0 and over, adult: Secondary | ICD-10-CM | POA: Diagnosis not present

## 2022-10-10 DIAGNOSIS — K219 Gastro-esophageal reflux disease without esophagitis: Secondary | ICD-10-CM | POA: Diagnosis present

## 2022-10-10 DIAGNOSIS — N179 Acute kidney failure, unspecified: Secondary | ICD-10-CM | POA: Diagnosis present

## 2022-10-10 DIAGNOSIS — D631 Anemia in chronic kidney disease: Secondary | ICD-10-CM | POA: Diagnosis not present

## 2022-10-10 DIAGNOSIS — I10 Essential (primary) hypertension: Secondary | ICD-10-CM

## 2022-10-10 DIAGNOSIS — E1165 Type 2 diabetes mellitus with hyperglycemia: Secondary | ICD-10-CM | POA: Diagnosis not present

## 2022-10-10 DIAGNOSIS — I110 Hypertensive heart disease with heart failure: Secondary | ICD-10-CM | POA: Diagnosis not present

## 2022-10-10 DIAGNOSIS — I161 Hypertensive emergency: Secondary | ICD-10-CM | POA: Diagnosis present

## 2022-10-10 DIAGNOSIS — I69354 Hemiplegia and hemiparesis following cerebral infarction affecting left non-dominant side: Secondary | ICD-10-CM

## 2022-10-10 DIAGNOSIS — I509 Heart failure, unspecified: Secondary | ICD-10-CM | POA: Diagnosis not present

## 2022-10-10 DIAGNOSIS — E1122 Type 2 diabetes mellitus with diabetic chronic kidney disease: Secondary | ICD-10-CM | POA: Diagnosis not present

## 2022-10-10 DIAGNOSIS — Z8673 Personal history of transient ischemic attack (TIA), and cerebral infarction without residual deficits: Secondary | ICD-10-CM | POA: Diagnosis not present

## 2022-10-10 DIAGNOSIS — E119 Type 2 diabetes mellitus without complications: Secondary | ICD-10-CM

## 2022-10-10 DIAGNOSIS — Z886 Allergy status to analgesic agent status: Secondary | ICD-10-CM | POA: Diagnosis not present

## 2022-10-10 DIAGNOSIS — Z6832 Body mass index (BMI) 32.0-32.9, adult: Secondary | ICD-10-CM

## 2022-10-10 DIAGNOSIS — I5033 Acute on chronic diastolic (congestive) heart failure: Secondary | ICD-10-CM | POA: Diagnosis not present

## 2022-10-10 DIAGNOSIS — Z79899 Other long term (current) drug therapy: Secondary | ICD-10-CM | POA: Diagnosis not present

## 2022-10-10 DIAGNOSIS — F17290 Nicotine dependence, other tobacco product, uncomplicated: Secondary | ICD-10-CM | POA: Diagnosis present

## 2022-10-10 DIAGNOSIS — Z8674 Personal history of sudden cardiac arrest: Secondary | ICD-10-CM

## 2022-10-10 DIAGNOSIS — E669 Obesity, unspecified: Secondary | ICD-10-CM | POA: Diagnosis present

## 2022-10-10 DIAGNOSIS — I5031 Acute diastolic (congestive) heart failure: Secondary | ICD-10-CM | POA: Diagnosis not present

## 2022-10-10 DIAGNOSIS — I130 Hypertensive heart and chronic kidney disease with heart failure and stage 1 through stage 4 chronic kidney disease, or unspecified chronic kidney disease: Principal | ICD-10-CM | POA: Diagnosis present

## 2022-10-10 DIAGNOSIS — R0602 Shortness of breath: Secondary | ICD-10-CM | POA: Diagnosis not present

## 2022-10-10 DIAGNOSIS — R030 Elevated blood-pressure reading, without diagnosis of hypertension: Secondary | ICD-10-CM | POA: Diagnosis not present

## 2022-10-10 DIAGNOSIS — R0601 Orthopnea: Secondary | ICD-10-CM | POA: Diagnosis not present

## 2022-10-10 HISTORY — DX: Radiographic dye allergy status: Z91.041

## 2022-10-10 HISTORY — DX: Type 2 diabetes mellitus without complications: E11.9

## 2022-10-10 HISTORY — DX: Cardiac arrest, cause unspecified: I46.9

## 2022-10-10 LAB — BASIC METABOLIC PANEL
Anion gap: 9 (ref 5–15)
BUN: 33 mg/dL — ABNORMAL HIGH (ref 8–23)
CO2: 24 mmol/L (ref 22–32)
Calcium: 8.6 mg/dL — ABNORMAL LOW (ref 8.9–10.3)
Chloride: 101 mmol/L (ref 98–111)
Creatinine, Ser: 1.45 mg/dL — ABNORMAL HIGH (ref 0.61–1.24)
GFR, Estimated: 52 mL/min — ABNORMAL LOW (ref >60)
Glucose, Bld: 299 mg/dL — ABNORMAL HIGH (ref 70–99)
Potassium: 4.9 mmol/L (ref 3.5–5.1)
Sodium: 134 mmol/L — ABNORMAL LOW (ref 135–145)

## 2022-10-10 LAB — CBC
HCT: 36.9 % — ABNORMAL LOW (ref 39.0–52.0)
Hemoglobin: 10.8 g/dL — ABNORMAL LOW (ref 13.0–17.0)
MCH: 25.3 pg — ABNORMAL LOW (ref 26.0–34.0)
MCHC: 29.3 g/dL — ABNORMAL LOW (ref 30.0–36.0)
MCV: 86.4 fL (ref 80.0–100.0)
Platelets: 152 10*3/uL (ref 150–400)
RBC: 4.27 MIL/uL (ref 4.22–5.81)
RDW: 16.9 % — ABNORMAL HIGH (ref 11.5–15.5)
WBC: 7.3 10*3/uL (ref 4.0–10.5)
nRBC: 0 % (ref 0.0–0.2)

## 2022-10-10 LAB — TROPONIN I (HIGH SENSITIVITY)
Troponin I (High Sensitivity): 22 ng/L — ABNORMAL HIGH (ref <18)
Troponin I (High Sensitivity): 22 ng/L — ABNORMAL HIGH (ref <18)

## 2022-10-10 LAB — BRAIN NATRIURETIC PEPTIDE: B Natriuretic Peptide: 1016 pg/mL — ABNORMAL HIGH (ref 0.0–100.0)

## 2022-10-10 MED ORDER — FUROSEMIDE 10 MG/ML IJ SOLN
40.0000 mg | Freq: Two times a day (BID) | INTRAMUSCULAR | Status: DC
  Administered 2022-10-11 – 2022-10-12 (×3): 40 mg via INTRAVENOUS
  Filled 2022-10-10 (×3): qty 4

## 2022-10-10 MED ORDER — FUROSEMIDE 10 MG/ML IJ SOLN
20.0000 mg | Freq: Once | INTRAMUSCULAR | Status: AC
  Administered 2022-10-10: 20 mg via INTRAVENOUS
  Filled 2022-10-10: qty 2

## 2022-10-10 MED ORDER — HYDRALAZINE HCL 20 MG/ML IJ SOLN
10.0000 mg | Freq: Once | INTRAMUSCULAR | Status: AC
  Administered 2022-10-10: 10 mg via INTRAVENOUS
  Filled 2022-10-10: qty 1

## 2022-10-10 MED ORDER — NITROGLYCERIN IN D5W 200-5 MCG/ML-% IV SOLN
0.0000 ug/min | INTRAVENOUS | Status: DC
  Administered 2022-10-10: 5 ug/min via INTRAVENOUS
  Administered 2022-10-11: 180 ug/min via INTRAVENOUS
  Administered 2022-10-11: 150 ug/min via INTRAVENOUS
  Filled 2022-10-10 (×3): qty 250

## 2022-10-10 MED ORDER — METHYLPREDNISOLONE SODIUM SUCC 40 MG IJ SOLR: 40.0000 mg | Freq: Once | INTRAMUSCULAR | Status: DC

## 2022-10-10 MED ORDER — INSULIN GLARGINE-YFGN 100 UNIT/ML ~~LOC~~ SOLN
20.0000 [IU] | Freq: Every day | SUBCUTANEOUS | Status: DC
  Administered 2022-10-11 – 2022-10-13 (×4): 20 [IU] via SUBCUTANEOUS
  Filled 2022-10-10 (×5): qty 0.2

## 2022-10-10 MED ORDER — ONDANSETRON HCL 4 MG/2ML IJ SOLN: 4.0000 mg | Freq: Four times a day (QID) | INTRAMUSCULAR | Status: DC | PRN

## 2022-10-10 MED ORDER — METOPROLOL TARTRATE 50 MG PO TABS
100.0000 mg | ORAL_TABLET | Freq: Two times a day (BID) | ORAL | Status: DC
  Administered 2022-10-10 – 2022-10-13 (×6): 100 mg via ORAL
  Filled 2022-10-10 (×6): qty 2

## 2022-10-10 MED ORDER — ONDANSETRON HCL 4 MG PO TABS: 4.0000 mg | ORAL_TABLET | Freq: Four times a day (QID) | ORAL | Status: DC | PRN

## 2022-10-10 MED ORDER — METOPROLOL TARTRATE 50 MG PO TABS: 100.0000 mg | ORAL_TABLET | Freq: Two times a day (BID) | ORAL | Status: DC

## 2022-10-10 MED ORDER — ENOXAPARIN SODIUM 40 MG/0.4ML IJ SOSY
40.0000 mg | PREFILLED_SYRINGE | INTRAMUSCULAR | Status: DC
  Filled 2022-10-10 (×3): qty 0.4

## 2022-10-10 MED ORDER — INSULIN ASPART 100 UNIT/ML IJ SOLN
0.0000 [IU] | Freq: Three times a day (TID) | INTRAMUSCULAR | Status: DC
  Administered 2022-10-11 (×3): 4 [IU] via SUBCUTANEOUS
  Administered 2022-10-12: 3 [IU] via SUBCUTANEOUS
  Administered 2022-10-12: 4 [IU] via SUBCUTANEOUS
  Administered 2022-10-12 – 2022-10-13 (×2): 3 [IU] via SUBCUTANEOUS

## 2022-10-10 MED ORDER — INSULIN ASPART 100 UNIT/ML IJ SOLN
0.0000 [IU] | Freq: Every day | INTRAMUSCULAR | Status: DC
  Administered 2022-10-11: 2 [IU] via SUBCUTANEOUS
  Filled 2022-10-10: qty 1

## 2022-10-10 MED ORDER — DIPHENHYDRAMINE HCL 50 MG/ML IJ SOLN: 50.0000 mg | Freq: Once | INTRAMUSCULAR | Status: DC

## 2022-10-10 MED ORDER — POTASSIUM CHLORIDE CRYS ER 20 MEQ PO TBCR
20.0000 meq | EXTENDED_RELEASE_TABLET | Freq: Two times a day (BID) | ORAL | Status: DC
  Administered 2022-10-11 – 2022-10-14 (×8): 20 meq via ORAL
  Filled 2022-10-10 (×8): qty 1

## 2022-10-10 NOTE — H&P (Signed)
History and Physical    Patient: Samuel Santos WUJ:[id number] DOB: [birth year] DOA: 10/10/2022 DOS: the patient was seen and examined on 10/10/2022 PCP: System, Provider Not In  Patient coming from: Home  Chief Complaint:  Chief Complaint  Patient presents with   Chest Pain   HPI: Samuel Santos is a 72 y.o. male with medical history significant of diabetes, hypertension, history of stroke.  Patient presents with chest pain that has been intermittent for the past 3 days.  He is also having shortness of breath, which accompanies the chest pain.  He did go to his PCP, who sent him here for evaluation.  Part of his shortness of breath is orthopnea.  His chest pain is described as a constant pressure.  He states that he has been prescribed atenolol which he takes.  He states that his blood pressure is always been difficult to control with that his blood pressure in the 190s systolically is his normally range.  Review of Systems: As mentioned in the history of present illness. All other systems reviewed and are negative. Past Medical History:  Diagnosis Date   Diabetes mellitus without complication    Hypertension    Stroke    Past Surgical History:  Procedure Laterality Date   APPENDECTOMY     FRACTURE SURGERY     JOINT REPLACEMENT     Social History:  reports that he has quit smoking. His smoking use included e-cigarettes. He has never used smokeless tobacco. He reports that he does not drink alcohol and does not use drugs.  Allergies  Allergen Reactions   Bee Venom Anaphylaxis   Contrast Media [Iodinated Contrast Media] Anaphylaxis   Naproxen Hives    Family History  Problem Relation Age of Onset   Diabetes Father    Heart attack Other     Prior to Admission medications   Medication Sig Start Date End Date Taking? Authorizing Provider  amoxicillin (AMOXIL) 500 MG capsule Take 1 capsule (500 mg total) by mouth 3 (three) times daily. 06/16/21   Bethann Berkshire, MD  atenolol  (TENORMIN) 50 MG tablet Take 50 mg by mouth daily.    [provider]  atenolol (TENORMIN) 50 MG tablet Take 1 tablet (50 mg total) by mouth daily. 02/21/22 03/23/22  Terald Sleeper, MD  diltiazem (CARDIZEM SR) 120 MG 12 hr capsule Take 120 mg by mouth daily. 03/06/16   [provider]  diltiazem (TIAZAC) 120 MG 24 hr capsule Take by mouth. 03/01/19   [provider]  glipiZIDE (GLUCOTROL) 5 MG tablet TAKE 1 TABLET TWICE DAILY  (NEED LABS FOR FURTHER REFILLS) 03/01/19   [provider]  JARDIANCE 25 MG TABS tablet Take 25 mg by mouth daily. 07/03/22   [provider]  nitroGLYCERIN (NITROSTAT) 0.4 MG SL tablet Place 1 tablet (0.4 mg total) under the tongue every 5 (five) minutes as needed for chest pain. Up to 3 doses per day 02/21/22   Terald Sleeper, MD  oxyCODONE-acetaminophen (PERCOCET) 5-325 MG tablet Take 1 tablet by mouth every 6 (six) hours as needed for up to 20 doses for moderate pain. 06/16/21   Bethann Berkshire, MD    Physical Exam: Vitals:   10/10/22 1830 10/10/22 1936 10/10/22 2000 10/10/22 2030  BP: (!) 204/105 (!) 197/104 (!) 198/101 (!) 193/112  Pulse:  78 77 79  Resp: 20 15 20  (!) 22  Temp:  98.2 F (36.8 C)    TempSrc:      SpO2:  100% 99%  98%  Weight:      Height:       General: Older male. Awake and alert and oriented x3. No acute cardiopulmonary distress.  HEENT: Normocephalic atraumatic.  Right and left ears normal in appearance.  Pupils equal, round, reactive to light. Extraocular muscles are intact. Sclerae anicteric and noninjected.  Moist mucosal membranes. No mucosal lesions.  Neck: Neck supple without lymphadenopathy. No carotid bruits. No masses palpated.  Cardiovascular: Regular rate with normal S1-S2 sounds. No murmurs, rubs, gallops auscultated. No JVD.  Respiratory: Rales in bases bilaterally.  No accessory muscle use. Abdomen: Soft, nontender, nondistended. Active bowel sounds. No masses or hepatosplenomegaly  Skin:  No rashes, lesions, or ulcerations.  Dry, warm to touch. 2+ dorsalis pedis and radial pulses. Musculoskeletal: No calf or leg pain. All major joints not erythematous nontender.  No upper or lower joint deformation.  Good ROM.  No contractures  Psychiatric: Intact judgment and insight. Pleasant and cooperative. Neurologic: No focal neurological deficits.  Patient has left-sided contracture of his right upper extremity.  Cranial nerves II through XII grossly intact.  No deficit in his right side..  Data Reviewed: Results for orders placed or performed during the hospital encounter of 10/10/22 (from the past 24 hour(s))  Basic metabolic panel     Status: Abnormal   Collection Time: 10/10/22  2:57 PM  Result Value Ref Range   Sodium 134 (L) 135 - 145 mmol/L   Potassium 4.9 3.5 - 5.1 mmol/L   Chloride 101 98 - 111 mmol/L   CO2 24 22 - 32 mmol/L   Glucose, Bld 299 (H) 70 - 99 mg/dL   BUN 33 (H) 8 - 23 mg/dL   Creatinine, Ser 4.09 (H) 0.61 - 1.24 mg/dL   Calcium 8.6 (L) 8.9 - 10.3 mg/dL   GFR, Estimated 52 (L) >60 mL/min   Anion gap 9 5 - 15  CBC     Status: Abnormal   Collection Time: 10/10/22  2:57 PM  Result Value Ref Range   WBC 7.3 4.0 - 10.5 K/uL   RBC 4.27 4.22 - 5.81 MIL/uL   Hemoglobin 10.8 (L) 13.0 - 17.0 g/dL   HCT 81.1 (L) 91.4 - 78.2 %   MCV 86.4 80.0 - 100.0 fL   MCH 25.3 (L) 26.0 - 34.0 pg   MCHC 29.3 (L) 30.0 - 36.0 g/dL   RDW 95.6 (H) 21.3 - 08.6 %   Platelets 152 150 - 400 K/uL   nRBC 0.0 0.0 - 0.2 %  Troponin I (High Sensitivity)     Status: Abnormal   Collection Time: 10/10/22  2:57 PM  Result Value Ref Range   Troponin I (High Sensitivity) 22 (H) <18 ng/L  Brain natriuretic peptide     Status: Abnormal   Collection Time: 10/10/22  3:15 PM  Result Value Ref Range   B Natriuretic Peptide 1,016.0 (H) 0.0 - 100.0 pg/mL  Troponin I (High Sensitivity)     Status: Abnormal   Collection Time: 10/10/22  4:46 PM  Result Value Ref Range   Troponin I (High Sensitivity)  22 (H) <18 ng/L    DG Chest Port 1 View  Result Date: 10/10/2022 CLINICAL DATA:  Shortness of breath. EXAM: PORTABLE CHEST 1 VIEW COMPARISON:  02/21/2022. FINDINGS: Rotated patient. Low lung volumes accentuate the pulmonary vasculature and cardiomediastinal silhouette. No consolidation or edema. No pleural effusion or pneumothorax. IMPRESSION: No evidence of acute cardiopulmonary disease. Electronically Signed   By: Orvan Falconer M.D.   On:  10/10/2022 15:30     Assessment and Plan: No notes have been filed under this hospital service. Service: Hospitalist  Principal Problem:   Acute exacerbation of CHF (congestive heart failure) Active Problems:   Hypertensive emergency   History of stroke   Diabetes mellitus without complication  Acute exacerbation of CHF Telemetry monitoring Strict I/O Daily Weights Diuresis: Lasix 20 mg twice daily Potassium: 20 mEq twice a day by mouth Echo cardiac exam tomorrow Repeat BMP tomorrow Hypertensive emergency Hydralazine IV given.  Patient also given metoprolol 100 mg p.o. which we will continue twice daily. Patient's blood pressure still elevated.  Will start on nitro drip History of stroke Aspirin Diabetes Sliding scale insulin started   Advance Care Planning:   Code Status: Full Code confirmed by patient  Consults: None  Family Communication: None  Severity of Illness: The appropriate patient status for this patient is INPATIENT. Inpatient status is judged to be reasonable and necessary in order to provide the required intensity of service to ensure the patient's safety. The patient's presenting symptoms, physical exam findings, and initial radiographic and laboratory data in the context of their chronic comorbidities is felt to place them at high risk for further clinical deterioration. Furthermore, it is not anticipated that the patient will be medically stable for discharge from the hospital within 2 midnights of admission.   * I  certify that at the point of admission it is my clinical judgment that the patient will require inpatient hospital care spanning beyond 2 midnights from the point of admission due to high intensity of service, high risk for further deterioration and high frequency of surveillance required.*  Author: Levie Heritage, DO 10/10/2022 9:14 PM  For on call review [email or web address].

## 2022-10-10 NOTE — ED Notes (Signed)
Pt care taken, assisted patient with bed pain then changed soiled linen, placed patient on dry bedding and gown. No other complaints at this time.

## 2022-10-10 NOTE — ED Triage Notes (Signed)
Pt c/o mid sternal chest for the last 2-3 days; pt states he think he was mowing the yard when the pain started; pt states he has sob when lying down   Pt has had one episode of diaphoresis

## 2022-10-10 NOTE — ED Notes (Signed)
Attempted x 2 for IV access without success.  

## 2022-10-10 NOTE — ED Provider Notes (Signed)
[facility]ERGENCY DEPARTMENT AT [facility] Provider Note   CSN: [id number] Arrival date & time: 10/10/22  1406     History  Chief Complaint  Patient presents with   Chest Pain    Samuel Santos is a 72 y.o. male.  Tree of hypertension, diabetes, CVA affecting left side in 2007, cardiac arrest September 2023.  Presents to ER complaining of chest pain and shortness of breath for the past 3 days, went to his primary care doctor today who sent him to the ED for further evaluation.  Patient states the pain has been constant described like pressure, has shortness of breath when lying flat.   He has never seen a cardiologist, he was referred previously but states he does not believe in specialist.   Chest Pain      Home Medications Prior to Admission medications   Medication Sig Start Date End Date Taking? Authorizing Provider  amoxicillin (AMOXIL) 500 MG capsule Take 1 capsule (500 mg total) by mouth 3 (three) times daily. 06/16/21   Bethann Berkshire, MD  atenolol (TENORMIN) 50 MG tablet Take 50 mg by mouth daily.    [provider]  atenolol (TENORMIN) 50 MG tablet Take 1 tablet (50 mg total) by mouth daily. 02/21/22 03/23/22  Terald Sleeper, MD  diltiazem (CARDIZEM SR) 120 MG 12 hr capsule Take 120 mg by mouth daily. 03/06/16   [provider]  diltiazem (TIAZAC) 120 MG 24 hr capsule Take by mouth. 03/01/19   [provider]  glipiZIDE (GLUCOTROL) 5 MG tablet TAKE 1 TABLET TWICE DAILY  (NEED LABS FOR FURTHER REFILLS) 03/01/19   [provider]  JARDIANCE 25 MG TABS tablet Take 25 mg by mouth daily. 07/03/22   [provider]  nitroGLYCERIN (NITROSTAT) 0.4 MG SL tablet Place 1 tablet (0.4 mg total) under the tongue every 5 (five) minutes as needed for chest pain. Up to 3 doses per day 02/21/22   Terald Sleeper, MD  oxyCODONE-acetaminophen (PERCOCET) 5-325 MG tablet Take 1 tablet by mouth every 6 (six) hours as needed for up to 20 doses  for moderate pain. 06/16/21   Bethann Berkshire, MD      Allergies    Bee venom, Contrast media [iodinated contrast media], and Naproxen    Review of Systems   Review of Systems  Cardiovascular:  Positive for chest pain.    Physical Exam Updated Vital Signs BP (!) 197/104   Pulse 78   Temp 98.2 F (36.8 C)   Resp 15   Ht 5\' 8"  (1.727 m)   Wt 96.2 kg   SpO2 100%   BMI 32.25 kg/m  Physical Exam Vitals and nursing note reviewed.  Constitutional:      General: He is not in acute distress.    Appearance: He is well-developed.  HENT:     Head: Normocephalic and atraumatic.  Eyes:     Conjunctiva/sclera: Conjunctivae normal.  Cardiovascular:     Rate and Rhythm: Normal rate and regular rhythm.     Heart sounds: No murmur heard. Pulmonary:     Effort: Pulmonary effort is normal. No respiratory distress.     Breath sounds: Normal breath sounds.  Abdominal:     Palpations: Abdomen is soft.     Tenderness: There is no abdominal tenderness.  Musculoskeletal:        General: No swelling.     Cervical back: Neck supple.     Right lower leg: 2+ Pitting Edema present.  Left lower leg: 2+ Pitting Edema present.  Skin:    General: Skin is warm and dry.     Capillary Refill: Capillary refill takes less than 2 seconds.  Neurological:     Mental Status: He is alert.  Psychiatric:        Mood and Affect: Mood normal.     ED Results / Procedures / Treatments   Labs (all labs ordered are listed, but only abnormal results are displayed) Labs Reviewed  BASIC METABOLIC PANEL - Abnormal; Notable for the following components:      Result Value   Sodium 134 (*)    Glucose, Bld 299 (*)    BUN 33 (*)    Creatinine, Ser 1.45 (*)    Calcium 8.6 (*)    GFR, Estimated 52 (*)    All other components within normal limits  CBC - Abnormal; Notable for the following components:   Hemoglobin 10.8 (*)    HCT 36.9 (*)    MCH 25.3 (*)    MCHC 29.3 (*)    RDW 16.9 (*)    All other  components within normal limits  BRAIN NATRIURETIC PEPTIDE - Abnormal; Notable for the following components:   B Natriuretic Peptide 1,016.0 (*)    All other components within normal limits  TROPONIN I (HIGH SENSITIVITY) - Abnormal; Notable for the following components:   Troponin I (High Sensitivity) 22 (*)    All other components within normal limits  TROPONIN I (HIGH SENSITIVITY) - Abnormal; Notable for the following components:   Troponin I (High Sensitivity) 22 (*)    All other components within normal limits    EKG EKG Interpretation  Date/Time:  Friday October 10 2022 14:41:39 EDT Ventricular Rate:  72 PR Interval:  124 QRS Duration: 76 QT Interval:  448 QTC Calculation: 490 R Axis:   25 Text Interpretation: Sinus rhythm with Premature ventricular complexes or Fusion complexes Nonspecific ST abnormality No significant change since prior 9/23 Confirmed by Meridee Score [phone]) on 10/10/2022 2:46:26 PM  Radiology DG Chest Port 1 View  Result Date: 10/10/2022 CLINICAL DATA:  Shortness of breath. EXAM: PORTABLE CHEST 1 VIEW COMPARISON:  02/21/2022. FINDINGS: Rotated patient. Low lung volumes accentuate the pulmonary vasculature and cardiomediastinal silhouette. No consolidation or edema. No pleural effusion or pneumothorax. IMPRESSION: No evidence of acute cardiopulmonary disease. Electronically Signed   By: Orvan Falconer M.D.   On: 10/10/2022 15:30    Procedures Procedures    Medications Ordered in ED Medications  metoprolol tartrate (LOPRESSOR) tablet 100 mg (100 mg Oral Given 10/10/22 1840)  furosemide (LASIX) injection 20 mg (20 mg Intravenous Given 10/10/22 1657)  hydrALAZINE (APRESOLINE) injection 10 mg (10 mg Intravenous Given 10/10/22 1839)  furosemide (LASIX) injection 20 mg (20 mg Intravenous Given 10/10/22 1839)    ED Course/ Medical Decision Making/ A&P                             Medical Decision Making This patient presents to the ED for concern of  shortness of breath x 3 days, this involves an extensive number of treatment options, and is a complaint that carries with it a high risk of complications and morbidity.  The differential diagnosis includes death, ACS, PE, pneumonia, other   Co morbidities that complicate the patient evaluation DM2, uncontrolled hypertension, cardiac arrest in September 2023   Additional history obtained:  Additional history obtained from EMR External records from outside source obtained  and reviewed including ED visit for chest pain in September 2023 through including labs from that visit   Lab Tests:  I Ordered, and personally interpreted labs.  The pertinent results include: Glucoses 299, BUN 33, creatinine 1.45, last baseline was 7 months ago.  CBC shows mild anemia with hemoglobin 10.8, troponins flat BNP is at 1016   Imaging Studies ordered:  I ordered imaging studies including chest x-ray I independently visualized and interpreted imaging which showed no acute cardiopulmonary disease I agree with the radiologist interpretation   Cardiac Monitoring: / EKG:  The patient was maintained on a cardiac monitor.  I personally viewed and interpreted the cardiac monitored which showed an underlying rhythm of: This rhythm   Consultations Obtained:  I requested consultation with the hospitalist Dr. Adrian Blackwater,  and discussed lab and imaging findings as well as pertinent plan -he has kindly agreed to admit this patient for further workup.  Plan to control blood pressure, further diuresis and pursue further workup for likely new onset CHF   Problem List / ED Course / Critical interventions / Medication management  Presenting for chest pain today ongoing for the past 3 days with shortness of breath when lying flat, patient has uncontrolled hypertension, does not regularly see doctors.  Reports history of cardiac arrest 7 months ago, was taking care of in [place], [place] and states that he never  followed up with cardiology.  Comes in today after seeing his PCP.  ECG does not show any ischemic changes, BNP is significantly elevated, bilateral pitting edema.  CHF is likely the cause of patient's shortness of breath and swelling.  Patient is willing to stay in the hospital for CHF.  Unable to find any history of patient having echocardiogram this would be new diagnosis of CHF for the patient. I ordered medication including lasix  for fluid overload  Reevaluation of the patient after these medicines showed that the patient stayed the same I have reviewed the patients home medicines and have made adjustments as needed       Amount and/or Complexity of Data Reviewed Labs: ordered. Radiology: ordered.           Final Clinical Impression(s) / ED Diagnoses Final diagnoses:  Heart failure, unspecified HF chronicity, unspecified heart failure type  Hypertension, unspecified type  Hyperglycemia due to diabetes mellitus    Rx / DC Orders ED Discharge Orders     None         Josem Kaufmann 10/10/22 2006    Eber Hong, MD 10/12/22 2252

## 2022-10-11 ENCOUNTER — Inpatient Hospital Stay (HOSPITAL_COMMUNITY): Payer: Medicare PPO

## 2022-10-11 DIAGNOSIS — I5031 Acute diastolic (congestive) heart failure: Secondary | ICD-10-CM | POA: Diagnosis not present

## 2022-10-11 DIAGNOSIS — Z8673 Personal history of transient ischemic attack (TIA), and cerebral infarction without residual deficits: Secondary | ICD-10-CM

## 2022-10-11 DIAGNOSIS — E119 Type 2 diabetes mellitus without complications: Secondary | ICD-10-CM

## 2022-10-11 DIAGNOSIS — I161 Hypertensive emergency: Secondary | ICD-10-CM | POA: Diagnosis not present

## 2022-10-11 DIAGNOSIS — I509 Heart failure, unspecified: Secondary | ICD-10-CM | POA: Diagnosis not present

## 2022-10-11 LAB — GLUCOSE, CAPILLARY
Glucose-Capillary: 189 mg/dL — ABNORMAL HIGH (ref 70–99)
Glucose-Capillary: 199 mg/dL — ABNORMAL HIGH (ref 70–99)
Glucose-Capillary: 152 mg/dL — ABNORMAL HIGH (ref 70–99)
Glucose-Capillary: 132 mg/dL — ABNORMAL HIGH (ref 70–99)

## 2022-10-11 LAB — CBC
HCT: 32.9 % — ABNORMAL LOW (ref 39.0–52.0)
Hemoglobin: 10 g/dL — ABNORMAL LOW (ref 13.0–17.0)
MCH: 25.6 pg — ABNORMAL LOW (ref 26.0–34.0)
MCHC: 30.4 g/dL (ref 30.0–36.0)
MCV: 84.4 fL (ref 80.0–100.0)
Platelets: 174 10*3/uL (ref 150–400)
RBC: 3.9 MIL/uL — ABNORMAL LOW (ref 4.22–5.81)
RDW: 17 % — ABNORMAL HIGH (ref 11.5–15.5)
WBC: 9.6 10*3/uL (ref 4.0–10.5)
nRBC: 0 % (ref 0.0–0.2)

## 2022-10-11 LAB — ECHOCARDIOGRAM COMPLETE
Area-P 1/2: 3.68 cm2
Height: 67 in
S' Lateral: 4.4 cm
Weight: 3375.68 oz

## 2022-10-11 LAB — BASIC METABOLIC PANEL
Anion gap: 9 (ref 5–15)
BUN: 32 mg/dL — ABNORMAL HIGH (ref 8–23)
CO2: 24 mmol/L (ref 22–32)
Calcium: 8.5 mg/dL — ABNORMAL LOW (ref 8.9–10.3)
Chloride: 103 mmol/L (ref 98–111)
Creatinine, Ser: 1.34 mg/dL — ABNORMAL HIGH (ref 0.61–1.24)
GFR, Estimated: 57 mL/min — ABNORMAL LOW (ref >60)
Glucose, Bld: 176 mg/dL — ABNORMAL HIGH (ref 70–99)
Potassium: 4.4 mmol/L (ref 3.5–5.1)
Sodium: 136 mmol/L (ref 135–145)

## 2022-10-11 LAB — CBG MONITORING, ED: Glucose-Capillary: 225 mg/dL — ABNORMAL HIGH (ref 70–99)

## 2022-10-11 LAB — HEMOGLOBIN A1C
Hgb A1c MFr Bld: 7.8 % — ABNORMAL HIGH (ref 4.8–5.6)
Mean Plasma Glucose: 177.16 mg/dL

## 2022-10-11 LAB — HIV ANTIBODY (ROUTINE TESTING W REFLEX): HIV Screen 4th Generation wRfx: NONREACTIVE

## 2022-10-11 LAB — MRSA NEXT GEN BY PCR, NASAL: MRSA by PCR Next Gen: NOT DETECTED

## 2022-10-11 MED ORDER — CHLORHEXIDINE GLUCONATE CLOTH 2 % EX PADS
6.0000 | MEDICATED_PAD | Freq: Every day | CUTANEOUS | Status: DC
  Administered 2022-10-11 – 2022-10-12 (×2): 6 via TOPICAL

## 2022-10-11 MED ORDER — AMLODIPINE BESYLATE 5 MG PO TABS
10.0000 mg | ORAL_TABLET | Freq: Every day | ORAL | Status: DC
  Administered 2022-10-11 – 2022-10-14 (×4): 10 mg via ORAL
  Filled 2022-10-11 (×4): qty 2

## 2022-10-11 MED ORDER — HYDRALAZINE HCL 20 MG/ML IJ SOLN
10.0000 mg | Freq: Four times a day (QID) | INTRAMUSCULAR | Status: DC | PRN
  Administered 2022-10-11 – 2022-10-12 (×3): 10 mg via INTRAVENOUS
  Filled 2022-10-11 (×3): qty 1

## 2022-10-11 MED ORDER — PERFLUTREN LIPID MICROSPHERE
1.0000 mL | INTRAVENOUS | Status: AC | PRN
  Administered 2022-10-11: 2 mL via INTRAVENOUS

## 2022-10-11 MED ORDER — ACETAMINOPHEN 325 MG PO TABS
650.0000 mg | ORAL_TABLET | Freq: Four times a day (QID) | ORAL | Status: DC | PRN
  Administered 2022-10-11 – 2022-10-13 (×2): 650 mg via ORAL
  Filled 2022-10-11 (×2): qty 2

## 2022-10-11 NOTE — Progress Notes (Signed)
[facility]  PROGRESS NOTE  Samuel Santos ZOX:[id number] DOB: December 19, 1950 DOA: 10/10/2022 PCP: System, Provider Not In   Brief HPI:   71 year old male with medical history of diabetes mellitus type 2, hypertension, cardiac arrest in September 2023.  History of stroke came with chest pain for past 3 days, intermittent.  Also having shortness of breath.  Described chest pain as constant pressure.  Complains of shortness of breath when laying flat. Found to have BNP 1016, mild anemia with hemoglobin 10.8.  Creatinine 1.45.  Echocardiogram ordered.  Started on diuresis with Lasix.    Subjective   Patient seen and examined, denies shortness of breath.  Started on nitro gtt. yesterday for CHF exacerbation.   Assessment/Plan:    Acute exacerbation of CHF -Unknown diastolic versus systolic -BNP significantly elevated 1016, 2D echo has been ordered -Started on aggressive diuresis with Lasix 40 mg IV every 12 hour -Continue strict intake and output, daily weights -Will follow echo results -Consider cardiology consultation based on the echo results.  Hypertensive emergency -Continue metoprolol 100 mg twice daily -Blood pressure better controlled, continue hydralazine as needed -Also started on nitroglycerin gtt. -Will start amlodipine 10 mg daily, wean off nitro gtt. as blood pressure permits.  Acute kidney injury -Likely cardiorenal syndrome -Creatinine 1.45, improved to 1.34 this morning with diuresis -Follow BMP in am  Diabetes mellitus type 2 -CBG well-controlled -Continue Lantus 20 units subcu daily -Sliding scale insulin with NovoLog  Medications     Chlorhexidine Gluconate Cloth  6 each Topical Q0600   enoxaparin (LOVENOX) injection  40 mg Subcutaneous Q24H   furosemide  40 mg Intravenous BID   insulin aspart  0-20 Units Subcutaneous TID WC   insulin aspart  0-5 Units Subcutaneous QHS   insulin glargine-yfgn  20 Units Subcutaneous QHS   metoprolol tartrate  100 mg  Oral BID   potassium chloride  20 mEq Oral BID     Data Reviewed:   CBG:  Recent Labs  Lab 10/11/22 0017 10/11/22 0735  GLUCAP 225* 189*    SpO2: 97 %    Vitals:   10/11/22 0645 10/11/22 0700 10/11/22 0715 10/11/22 0733  BP: (!) 181/96 (!) 162/78 (!) 156/69   Pulse: 69 69 66 70  Resp: Temp:    98.7 F (37.1 C)  TempSrc:    Oral  SpO2: 97% 97% 96% 97%  Weight:      Height:          Data Reviewed:  Basic Metabolic Panel: Recent Labs  Lab 10/10/22 1457 10/11/22 0431  NA 134* 136  K 4.9 4.4  CL 101 103  CO2 24 24  GLUCOSE 299* 176*  BUN 33* 32*  CREATININE 1.45* 1.34*  CALCIUM 8.6* 8.5*    CBC: Recent Labs  Lab 10/10/22 1457 10/11/22 0431  WBC 7.3 9.6  HGB 10.8* 10.0*  HCT 36.9* 32.9*  MCV 86.4 84.4  PLT 152 174    LFT No results for input(s): "AST", "ALT", "ALKPHOS", "BILITOT", "PROT", "ALBUMIN" in the last 168 hours.   Antibiotics: Anti-infectives (From admission, onward)    None        DVT prophylaxis: Lovenox  Code Status: Full code  Family Communication:    CONSULTS    Objective    Physical Examination:   General-appears in no acute distress Heart-S1-S2, regular, no murmur auscultated Lungs-clear to auscultation bilaterally, no wheezing or crackles auscultated Abdomen-soft, nontender, no organomegaly Extremities-no edema in the lower extremities Neuro-alert, oriented  x3, left hemiplegia   Status is: Inpatient:             Meredeth Ide   [facility] If 7PM-7AM, please contact night-coverage at [email or web address], Office  [phone]   10/11/2022, 8:48 AM  LOS: 1 day

## 2022-10-11 NOTE — TOC Initial Note (Signed)
Transition of Care [facility]) - Initial/Assessment Note    Patient Details  Name: Samuel Santos MRN: [id number] Date of Birth: 23-Nov-1950  Transition of Care (TOC) CM/SW Contact:    Catalina Gravel, LCSW Phone Number: 10/11/2022, 4:30 PM  Clinical Narrative:                 .. Transition of Care [facility]) Screening Note   Patient Details  Name: Samuel Santos Date of Birth: December 16, 1950   Transition of Care (TOC) CM/SW Contact:    Catalina Gravel, LCSW Phone Number: 10/11/2022, 4:30 PM    Transition of Care Department [facility]) has reviewed patient and no TOC needs have been identified at this time. We will continue to monitor patient advancement through interdisciplinary progression rounds. If new patient transition needs arise, please place a TOC consult.      Barriers to Discharge: Continued Medical Work up   Patient Goals and CMS Choice            Expected Discharge Plan and Services                                              Prior Living Arrangements/Services                       Activities of Daily Living Home Assistive Devices/Equipment: Environmental consultant (specify type) ADL Screening (condition at time of admission) Patient's cognitive ability adequate to safely complete daily activities?: Yes Is the patient deaf or have difficulty hearing?: No Does the patient have difficulty seeing, even when wearing glasses/contacts?: No Does the patient have difficulty concentrating, remembering, or making decisions?: No Patient able to express need for assistance with ADLs?: Yes Does the patient have difficulty dressing or bathing?: No Independently performs ADLs?: Yes (appropriate for developmental age) Does the patient have difficulty walking or climbing stairs?: Yes Weakness of Legs: Left Weakness of Arms/Hands: Left  Permission Sought/Granted                  Emotional Assessment              Admission diagnosis:  Acute exacerbation of CHF  (congestive heart failure) [I50.9] Hypertension, unspecified type [I10] Heart failure, unspecified HF chronicity, unspecified heart failure type [I50.9] Hyperglycemia due to diabetes mellitus [E11.65] Patient Active Problem List   Diagnosis Date Noted   Acute exacerbation of CHF (congestive heart failure) 10/10/2022   Hypertensive emergency 10/10/2022   History of stroke 10/10/2022   Diabetes mellitus without complication 10/10/2022   PCP:  System, Provider Not In Pharmacy:   [id number] - [place], [place] - [address] [place] [address] Phone: [phone] Fax: [phone]  [id number] - [place], [place] - [address] [place] [address] Phone: [phone] Fax: [phone]     Social Determinants of Health (SDOH) Social History: SDOH Screenings   Food Insecurity: No Food Insecurity (10/11/2022)  Housing: Low Risk  (10/11/2022)  Transportation Needs: No Transportation Needs (10/11/2022)  Utilities: Not At Risk (10/11/2022)  Tobacco Use: Medium Risk (10/10/2022)   SDOH Interventions: Housing Interventions: Intervention Not Indicated   Readmission Risk Interventions     No data to display

## 2022-10-11 NOTE — Progress Notes (Signed)
  Echocardiogram 2D Echocardiogram has been performed.  Samuel Santos 10/11/2022, 12:12 PM

## 2022-10-11 NOTE — Plan of Care (Signed)
  Problem: Education: Goal: Ability to demonstrate management of disease process will improve Outcome: Not Progressing Goal: Ability to verbalize understanding of medication therapies will improve Outcome: Not Progressing Goal: Individualized Educational Video(s) Outcome: Not Progressing   Problem: Activity: Goal: Capacity to carry out activities will improve Outcome: Not Progressing   Problem: Cardiac: Goal: Ability to achieve and maintain adequate cardiopulmonary perfusion will improve Outcome: Not Progressing   Problem: Education: Goal: Knowledge of General Education information will improve Description: Including pain rating scale, medication(s)/side effects and non-pharmacologic comfort measures Outcome: Not Progressing   Problem: Health Behavior/Discharge Planning: Goal: Ability to manage health-related needs will improve Outcome: Not Progressing   Problem: Clinical Measurements: Goal: Ability to maintain clinical measurements within normal limits will improve Outcome: Not Progressing Goal: Will remain free from infection Outcome: Not Progressing Goal: Diagnostic test results will improve Outcome: Not Progressing Goal: Respiratory complications will improve Outcome: Not Progressing Goal: Cardiovascular complication will be avoided Outcome: Not Progressing   Problem: Activity: Goal: Risk for activity intolerance will decrease Outcome: Not Progressing   Problem: Nutrition: Goal: Adequate nutrition will be maintained Outcome: Not Progressing   Problem: Coping: Goal: Level of anxiety will decrease Outcome: Not Progressing   Problem: Elimination: Goal: Will not experience complications related to bowel motility Outcome: Not Progressing Goal: Will not experience complications related to urinary retention Outcome: Not Progressing   Problem: Pain Managment: Goal: General experience of comfort will improve Outcome: Not Progressing   Problem: Safety: Goal:  Ability to remain free from injury will improve Outcome: Not Progressing   Problem: Skin Integrity: Goal: Risk for impaired skin integrity will decrease Outcome: Not Progressing   

## 2022-10-11 NOTE — ED Notes (Signed)
ED TO INPATIENT HANDOFF REPORT  ED Nurse Name and Phone #: Efraim Kaufmann, RN  S Name/Age/Gender Samuel Santos 72 y.o. male Room/Bed: APA02/APA02  Code Status   Code Status: Full Code  Home/SNF/Other Home Patient oriented to: self, place, time, and situation Is this baseline? Yes   Triage Complete: Triage complete  Chief Complaint Acute exacerbation of CHF (congestive heart failure) [I50.9]  Triage Note Pt c/o mid sternal chest for the last 2-3 days; pt states he think he was mowing the yard when the pain started; pt states he has sob when lying down   Pt has had one episode of diaphoresis    Allergies Allergies  Allergen Reactions   Bee Venom Anaphylaxis   Contrast Media [Iodinated Contrast Media] Anaphylaxis   Naproxen Hives    Level of Care/Admitting Diagnosis ED Disposition     ED Disposition  Admit   Condition  --   Comment  Hospital Area: [facility] [[id number]]  Level of Care: Stepdown [14]  Covid Evaluation: Asymptomatic - no recent exposure (last 10 days) testing not required  Diagnosis: Acute exacerbation of CHF (congestive heart failure) [161096]  Admitting Physician: Levie Heritage [[id number]]  Attending Physician: Levie Heritage [[id number]]  Certification:: I certify this patient will need inpatient services for at least 2 midnights  Estimated Length of Stay: 2          B Medical/Surgery History Past Medical History:  Diagnosis Date   Diabetes mellitus without complication    Hypertension    Stroke    Past Surgical History:  Procedure Laterality Date   APPENDECTOMY     FRACTURE SURGERY     JOINT REPLACEMENT       A IV Location/Drains/Wounds Patient Lines/Drains/Airways Status     Active Line/Drains/Airways     Name Placement date Placement time Site Days   Peripheral IV 10/10/22 20 G 1" Right Antecubital 10/10/22  1519  Antecubital  1   Peripheral IV 10/10/22 20 G 1" Anterior;Right Forearm 10/10/22  2035  Forearm  1             Intake/Output Last 24 hours  Intake/Output Summary (Last 24 hours) at 10/11/2022 0206 Last data filed at 10/11/2022 0140 Gross per 24 hour  Intake 43.91 ml  Output --  Net 43.91 ml    Labs/Imaging Results for orders placed or performed during the hospital encounter of 10/10/22 (from the past 48 hour(s))  Basic metabolic panel     Status: Abnormal   Collection Time: 10/10/22  2:57 PM  Result Value Ref Range   Sodium 134 (L) 135 - 145 mmol/L   Potassium 4.9 3.5 - 5.1 mmol/L   Chloride 101 98 - 111 mmol/L   CO2 24 22 - 32 mmol/L   Glucose, Bld 299 (H) 70 - 99 mg/dL    Comment: Glucose reference range applies only to samples taken after fasting for at least 8 hours.   BUN 33 (H) 8 - 23 mg/dL   Creatinine, Ser 0.45 (H) 0.61 - 1.24 mg/dL   Calcium 8.6 (L) 8.9 - 10.3 mg/dL   GFR, Estimated 52 (L) >60 mL/min    Comment: (NOTE) Calculated using the CKD-EPI Creatinine Equation (2021)    Anion gap 9 5 - 15    Comment: Performed at [facility], [address], [place], [place] [address]  CBC     Status: Abnormal   Collection Time: 10/10/22  2:57 PM  Result Value Ref Range   WBC 7.3 4.0 -  10.5 K/uL   RBC 4.27 4.22 - 5.81 MIL/uL   Hemoglobin 10.8 (L) 13.0 - 17.0 g/dL   HCT 16.1 (L) 09.6 - 04.5 %   MCV 86.4 80.0 - 100.0 fL   MCH 25.3 (L) 26.0 - 34.0 pg   MCHC 29.3 (L) 30.0 - 36.0 g/dL   RDW 40.9 (H) 81.1 - 91.4 %   Platelets 152 150 - 400 K/uL   nRBC 0.0 0.0 - 0.2 %    Comment: Performed at [facility], [address], [place], [place] [address]  Troponin I (High Sensitivity)     Status: Abnormal   Collection Time: 10/10/22  2:57 PM  Result Value Ref Range   Troponin I (High Sensitivity) 22 (H) <18 ng/L    Comment: (NOTE) Elevated high sensitivity troponin I (hsTnI) values and significant  changes across serial measurements may suggest ACS but many other  chronic and acute conditions are known to elevate hsTnI results.  Refer to the "Links" section for chest pain algorithms  and additional  guidance. Performed at [facility], [address] [place], [place] [address]   Brain natriuretic peptide     Status: Abnormal   Collection Time: 10/10/22  3:15 PM  Result Value Ref Range   B Natriuretic Peptide 1,016.0 (H) 0.0 - 100.0 pg/mL    Comment: Performed at [facility], [address], [place], [place] [address]  Troponin I (High Sensitivity)     Status: Abnormal   Collection Time: 10/10/22  4:46 PM  Result Value Ref Range   Troponin I (High Sensitivity) 22 (H) <18 ng/L    Comment: (NOTE) Elevated high sensitivity troponin I (hsTnI) values and significant  changes across serial measurements may suggest ACS but many other  chronic and acute conditions are known to elevate hsTnI results.  Refer to the "Links" section for chest pain algorithms and additional  guidance. Performed at [facility], [address] [place], [place] [address]   CBG monitoring, ED     Status: Abnormal   Collection Time: 10/11/22 12:17 AM  Result Value Ref Range   Glucose-Capillary 225 (H) 70 - 99 mg/dL    Comment: Glucose reference range applies only to samples taken after fasting for at least 8 hours.   DG Chest Port 1 View  Result Date: 10/10/2022 CLINICAL DATA:  Shortness of breath. EXAM: PORTABLE CHEST 1 VIEW COMPARISON:  02/21/2022. FINDINGS: Rotated patient. Low lung volumes accentuate the pulmonary vasculature and cardiomediastinal silhouette. No consolidation or edema. No pleural effusion or pneumothorax. IMPRESSION: No evidence of acute cardiopulmonary disease. Electronically Signed   By: Orvan Falconer M.D.   On: 10/10/2022 15:30    Pending Labs Unresulted Labs (From admission, onward)     Start     Ordered   10/11/22 0500  Basic metabolic panel  Tomorrow morning,   R        10/10/22 2343   10/11/22 0500  CBC  Tomorrow morning,   R        10/10/22 2343   10/11/22 0500  HIV Antibody (routine testing w rflx)  Once,   R        10/11/22 0500   10/11/22 0201  MRSA Next  Gen by PCR, Nasal  Once,   R        10/11/22 0200   10/10/22 2344  Hemoglobin A1c  (Glycemic Control (SSI)  Q 4 Hours / Glycemic Control (SSI)  AC +/- HS)  Once,   R  Comments: To assess prior glycemic control    10/10/22 2343            Vitals/Pain Today's Vitals   10/11/22 0030 10/11/22 0130 10/11/22 0200 10/11/22 0205  BP: (!) 180/96 (!) 172/81 (!) 167/93   Pulse: 66 64 65   Resp: (!) Temp:    98 F (36.7 C)  TempSrc:    Oral  SpO2: 99% 96% 97%   Weight:      Height:      PainSc:        Isolation Precautions No active isolations  Medications Medications  metoprolol tartrate (LOPRESSOR) tablet 100 mg (100 mg Oral Given 10/10/22 1840)  nitroGLYCERIN 50 mg in dextrose 5 % 250 mL (0.2 mg/mL) infusion (50 mcg/min Intravenous Infusion Verify 10/11/22 0140)  furosemide (LASIX) injection 40 mg (has no administration in time range)  potassium chloride SA (KLOR-CON M) CR tablet 20 mEq (20 mEq Oral Given 10/11/22 0024)  enoxaparin (LOVENOX) injection 40 mg (has no administration in time range)  ondansetron (ZOFRAN) tablet 4 mg (has no administration in time range)    Or  ondansetron (ZOFRAN) injection 4 mg (has no administration in time range)  insulin aspart (novoLOG) injection 0-5 Units (2 Units Subcutaneous Given 10/11/22 0024)  insulin aspart (novoLOG) injection 0-20 Units (has no administration in time range)  insulin glargine-yfgn (SEMGLEE) injection 20 Units (20 Units Subcutaneous Given 10/11/22 0024)  Chlorhexidine Gluconate Cloth 2 % PADS 6 each (has no administration in time range)  furosemide (LASIX) injection 20 mg (20 mg Intravenous Given 10/10/22 1657)  hydrALAZINE (APRESOLINE) injection 10 mg (10 mg Intravenous Given 10/10/22 1839)  furosemide (LASIX) injection 20 mg (20 mg Intravenous Given 10/10/22 1839)    Mobility walks with device     Focused Assessments Cardiac Assessment Handoff:    No results found for: "CKTOTAL", "CKMB", "CKMBINDEX",  "TROPONINI" No results found for: "DDIMER" Does the Patient currently have chest pain? No    R Recommendations: See Admitting Provider Note  Report given to:   Additional Notes: pt walks with walker due to left sided weakness from previous CVA

## 2022-10-12 DIAGNOSIS — Z8673 Personal history of transient ischemic attack (TIA), and cerebral infarction without residual deficits: Secondary | ICD-10-CM | POA: Diagnosis not present

## 2022-10-12 DIAGNOSIS — I509 Heart failure, unspecified: Secondary | ICD-10-CM | POA: Diagnosis not present

## 2022-10-12 DIAGNOSIS — E119 Type 2 diabetes mellitus without complications: Secondary | ICD-10-CM | POA: Diagnosis not present

## 2022-10-12 DIAGNOSIS — I161 Hypertensive emergency: Secondary | ICD-10-CM | POA: Diagnosis not present

## 2022-10-12 LAB — BASIC METABOLIC PANEL
Anion gap: 9 (ref 5–15)
BUN: 33 mg/dL — ABNORMAL HIGH (ref 8–23)
CO2: 24 mmol/L (ref 22–32)
Calcium: 8.4 mg/dL — ABNORMAL LOW (ref 8.9–10.3)
Chloride: 102 mmol/L (ref 98–111)
Creatinine, Ser: 1.18 mg/dL (ref 0.61–1.24)
GFR, Estimated: >60 mL/min (ref >60)
Glucose, Bld: 123 mg/dL — ABNORMAL HIGH (ref 70–99)
Potassium: 4.4 mmol/L (ref 3.5–5.1)
Sodium: 135 mmol/L (ref 135–145)

## 2022-10-12 LAB — GLUCOSE, CAPILLARY
Glucose-Capillary: 127 mg/dL — ABNORMAL HIGH (ref 70–99)
Glucose-Capillary: 129 mg/dL — ABNORMAL HIGH (ref 70–99)
Glucose-Capillary: 169 mg/dL — ABNORMAL HIGH (ref 70–99)
Glucose-Capillary: 153 mg/dL — ABNORMAL HIGH (ref 70–99)

## 2022-10-12 LAB — TROPONIN I (HIGH SENSITIVITY)
Troponin I (High Sensitivity): 29 ng/L — ABNORMAL HIGH (ref <18)
Troponin I (High Sensitivity): 31 ng/L — ABNORMAL HIGH (ref <18)

## 2022-10-12 MED ORDER — FUROSEMIDE 40 MG PO TABS
40.0000 mg | ORAL_TABLET | Freq: Two times a day (BID) | ORAL | Status: DC
  Administered 2022-10-12 – 2022-10-14 (×4): 40 mg via ORAL
  Filled 2022-10-12: qty 1
  Filled 2022-10-12: qty 2
  Filled 2022-10-12 (×2): qty 1

## 2022-10-12 MED ORDER — PANTOPRAZOLE SODIUM 40 MG PO TBEC
40.0000 mg | DELAYED_RELEASE_TABLET | Freq: Every day | ORAL | Status: DC
  Administered 2022-10-13: 40 mg via ORAL
  Filled 2022-10-12: qty 1

## 2022-10-12 NOTE — Progress Notes (Signed)
[facility]  PROGRESS NOTE  Matheu Ploeger ZOX:[id number] DOB: 1951/05/11 DOA: 10/10/2022 PCP: System, Provider Not In   Brief HPI:   72 year old male with medical history of diabetes mellitus type 2, hypertension, cardiac arrest in September 2023.  History of stroke came with chest pain for past 3 days, intermittent.  Also having shortness of breath.  Described chest pain as constant pressure.  Complains of shortness of breath when laying flat. Found to have BNP 1016, mild anemia with hemoglobin 10.8.  Creatinine 1.45.  Echocardiogram ordered.  Started on diuresis with Lasix.    Subjective   Complains of chest pain this morning.  EKG unremarkable.   Assessment/Plan:    Acute diastolic CHF -Presented with worsening shortness of breath -BNP significantly elevated 1016, 2D echo  ordered -Started on aggressive diuresis with Lasix 40 mg IV every 12 hour; diuresing well -Will switch to Lasix 40 mg p.o. twice daily -Continue strict intake and output, daily weights -Echocardiogram shows EF 55 to 60%, grade 2 diastolic dysfunction, right ventricle systolic function mildly reduced   Chest pain -EKG obtained is unremarkable -Troponin checked this morning is 29, 31 -Likely due to GERD; start pantoprazole  Hypertensive emergency -Resolved -Continue metoprolol 100 mg twice daily -Blood pressure better controlled, continue hydralazine as needed -Also started on nitroglycerin gtt.; weaned off -continue  amlodipine 10 mg daily  Acute kidney injury -Likely cardiorenal syndrome -Creatinine 1.45, improved to 1.18 this morning with diuresis -Follow BMP in am  Diabetes mellitus type 2 -CBG well-controlled -Continue Lantus 20 units subcu daily -Sliding scale insulin with NovoLog  Medications     amLODipine  10 mg Oral Daily   Chlorhexidine Gluconate Cloth  6 each Topical Q0600   enoxaparin (LOVENOX) injection  40 mg Subcutaneous Q24H   furosemide  40 mg Intravenous BID   insulin  aspart  0-20 Units Subcutaneous TID WC   insulin aspart  0-5 Units Subcutaneous QHS   insulin glargine-yfgn  20 Units Subcutaneous QHS   metoprolol tartrate  100 mg Oral BID   potassium chloride  20 mEq Oral BID     Data Reviewed:   CBG:  Recent Labs  Lab 10/11/22 0735 10/11/22 1123 10/11/22 1612 10/11/22 2206 10/12/22 0719  GLUCAP 189* 199* 152* 132* 127*    SpO2: 95 %    Vitals:   10/12/22 0700 10/12/22 0722 10/12/22 0730 10/12/22 0800  BP: (!) 112/53   (!) 143/56  Pulse: 63 64 63 65  Resp: Temp:  98.1 F (36.7 C)    TempSrc:  Oral    SpO2: 96% 96% 96% 95%  Weight:      Height:          Data Reviewed:  Basic Metabolic Panel: Recent Labs  Lab 10/10/22 1457 10/11/22 0431 10/12/22 0316  NA 134* 136 135  K 4.9 4.4 4.4  CL 101 103 102  CO2 GLUCOSE 299* 176* 123*  BUN 33* 32* 33*  CREATININE 1.45* 1.34* 1.18  CALCIUM 8.6* 8.5* 8.4*    CBC: Recent Labs  Lab 10/10/22 1457 10/11/22 0431  WBC 7.3 9.6  HGB 10.8* 10.0*  HCT 36.9* 32.9*  MCV 86.4 84.4  PLT 152 174    LFT No results for input(s): "AST", "ALT", "ALKPHOS", "BILITOT", "PROT", "ALBUMIN" in the last 168 hours.   Antibiotics: Anti-infectives (From admission, onward)    None        DVT prophylaxis: Lovenox  Code Status: Full code  Family Communication:    CONSULTS    Objective    Physical Examination:   General-appears in no acute distress Heart-S1-S2, regular, no murmur auscultated Lungs-clear to auscultation bilaterally, no wheezing or crackles auscultated Abdomen-soft, nontender, no organomegaly Extremities-no edema in the lower extremities Neuro-alert, oriented x3, no focal deficit noted   Status is: Inpatient:             Meredeth Ide   [facility] If 7PM-7AM, please contact night-coverage at [email or web address], Office  [phone]   10/12/2022, 8:56 AM  LOS: 2 days

## 2022-10-12 NOTE — Plan of Care (Signed)
  Problem: Education: Goal: Ability to demonstrate management of disease process will improve Outcome: Not Progressing Goal: Ability to verbalize understanding of medication therapies will improve Outcome: Not Progressing Goal: Individualized Educational Video(s) Outcome: Not Progressing   Problem: Activity: Goal: Capacity to carry out activities will improve Outcome: Not Progressing   Problem: Cardiac: Goal: Ability to achieve and maintain adequate cardiopulmonary perfusion will improve Outcome: Not Progressing   Problem: Education: Goal: Knowledge of General Education information will improve Description: Including pain rating scale, medication(s)/side effects and non-pharmacologic comfort measures Outcome: Not Progressing   Problem: Health Behavior/Discharge Planning: Goal: Ability to manage health-related needs will improve Outcome: Not Progressing   Problem: Clinical Measurements: Goal: Ability to maintain clinical measurements within normal limits will improve Outcome: Not Progressing Goal: Will remain free from infection Outcome: Not Progressing Goal: Diagnostic test results will improve Outcome: Not Progressing Goal: Respiratory complications will improve Outcome: Not Progressing Goal: Cardiovascular complication will be avoided Outcome: Not Progressing   Problem: Activity: Goal: Risk for activity intolerance will decrease Outcome: Not Progressing   Problem: Nutrition: Goal: Adequate nutrition will be maintained Outcome: Not Progressing   Problem: Coping: Goal: Level of anxiety will decrease Outcome: Not Progressing   Problem: Elimination: Goal: Will not experience complications related to bowel motility Outcome: Not Progressing Goal: Will not experience complications related to urinary retention Outcome: Not Progressing   Problem: Pain Managment: Goal: General experience of comfort will improve Outcome: Not Progressing   Problem: Safety: Goal:  Ability to remain free from injury will improve Outcome: Not Progressing   Problem: Skin Integrity: Goal: Risk for impaired skin integrity will decrease Outcome: Not Progressing   

## 2022-10-13 ENCOUNTER — Encounter (HOSPITAL_COMMUNITY): Payer: Self-pay | Admitting: Physician Assistant

## 2022-10-13 ENCOUNTER — Encounter: Payer: Self-pay | Admitting: *Deleted

## 2022-10-13 DIAGNOSIS — I509 Heart failure, unspecified: Secondary | ICD-10-CM | POA: Diagnosis not present

## 2022-10-13 LAB — GLUCOSE, CAPILLARY
Glucose-Capillary: 166 mg/dL — ABNORMAL HIGH (ref 70–99)
Glucose-Capillary: 87 mg/dL (ref 70–99)
Glucose-Capillary: 97 mg/dL (ref 70–99)
Glucose-Capillary: 148 mg/dL — ABNORMAL HIGH (ref 70–99)
Glucose-Capillary: 174 mg/dL — ABNORMAL HIGH (ref 70–99)

## 2022-10-13 LAB — COMPREHENSIVE METABOLIC PANEL
ALT: 21 U/L (ref 0–44)
AST: 17 U/L (ref 15–41)
Albumin: 3.3 g/dL — ABNORMAL LOW (ref 3.5–5.0)
Alkaline Phosphatase: 93 U/L (ref 38–126)
Anion gap: 8 (ref 5–15)
BUN: 41 mg/dL — ABNORMAL HIGH (ref 8–23)
CO2: 24 mmol/L (ref 22–32)
Calcium: 8.5 mg/dL — ABNORMAL LOW (ref 8.9–10.3)
Chloride: 102 mmol/L (ref 98–111)
Creatinine, Ser: 1.11 mg/dL (ref 0.61–1.24)
GFR, Estimated: >60 mL/min (ref >60)
Glucose, Bld: 86 mg/dL (ref 70–99)
Potassium: 4.4 mmol/L (ref 3.5–5.1)
Sodium: 134 mmol/L — ABNORMAL LOW (ref 135–145)
Total Bilirubin: 1 mg/dL (ref 0.3–1.2)
Total Protein: 6.6 g/dL (ref 6.5–8.1)

## 2022-10-13 MED ORDER — MELATONIN 3 MG PO TABS
6.0000 mg | ORAL_TABLET | Freq: Once | ORAL | Status: AC
  Administered 2022-10-13: 6 mg via ORAL
  Filled 2022-10-13: qty 2

## 2022-10-13 MED ORDER — CARVEDILOL 12.5 MG PO TABS
12.5000 mg | ORAL_TABLET | Freq: Two times a day (BID) | ORAL | Status: DC
  Administered 2022-10-13 – 2022-10-14 (×2): 12.5 mg via ORAL
  Filled 2022-10-13 (×2): qty 1

## 2022-10-13 NOTE — Progress Notes (Signed)
PROGRESS NOTE    Samuel Santos  WUJ:[id number] DOB: 10-29-1950 DOA: 10/10/2022 PCP: System, Provider Not In   Brief Narrative:    72 year old male with medical history of diabetes mellitus type 2, hypertension, cardiac arrest in September 2023.  History of stroke came with chest pain for past 3 days, intermittent.  Also having shortness of breath.  Described chest pain as constant pressure.  Complains of shortness of breath when laying flat. Found to have BNP 1016, mild anemia with hemoglobin 10.8.  Creatinine 1.45.  Echocardiogram ordered with noted grade 2 diastolic dysfunction and preserved LVEF.  Started on diuresis with Lasix which is now oral, but he continues to have ongoing chest pain and symptomatology as prior.  Cardiology, consulted to assist with evaluation and management.  Assessment & Plan:   Principal Problem:   Acute exacerbation of CHF (congestive heart failure) Active Problems:   Hypertensive emergency   History of stroke   Diabetes mellitus without complication  Assessment and Plan:  Acute diastolic CHF -Presented with worsening shortness of breath -BNP significantly elevated 1016, 2D echo  ordered -Started on aggressive diuresis with Lasix 40 mg IV every 12 hour; diuresing well -Will switch to Lasix 40 mg p.o. twice daily -Continue strict intake and output, daily weights -Echocardiogram shows EF 55 to 60%, grade 2 diastolic dysfunction, right ventricle systolic function mildly reduced     Chest pain-ongoing -EKG obtained is unremarkable -Troponin checked this morning is 29, 31 -Possibly due to GERD, but no improvement on PPI -Appreciate cardiology evaluation as some of this pain appears exertional   Hypertensive emergency -Resolved -Continue metoprolol 100 mg twice daily -Blood pressure better controlled, continue hydralazine as needed -Also started on nitroglycerin gtt.; weaned off -continue  amlodipine 10 mg daily   Acute kidney injury -Likely  cardiorenal syndrome -Creatinine 1.45, improved to 1.18 this morning with diuresis -Follow BMP in am   Diabetes mellitus type 2 -CBG well-controlled -Continue Lantus 20 units subcu daily -Sliding scale insulin with NovoLog  Obesity -BMI 32.8   DVT prophylaxis: Lovenox Code Status: Full Family Communication: None at bedside Disposition Plan:  Status is: Inpatient Remains inpatient appropriate because: Need for IV medications.  Consultants:  Cardiology  Procedures:  None  Antimicrobials:  None   Subjective: Patient seen and evaluated today with ongoing chest pressure that appears to be worsening with exertion.  He denies any dyspnea.  Objective: Vitals:   10/12/22 1852 10/12/22 2001 10/13/22 0418 10/13/22 0500  BP: (!) 166/81 (!) 170/73 (!) 165/93   Pulse: 66 65 (!) 59   Resp: 18  20   Temp:  98.3 F (36.8 C) 98.6 F (37 C)   TempSrc:  Oral Oral   SpO2: 99% 100% 98%   Weight:    95 kg  Height:        Intake/Output Summary (Last 24 hours) at 10/13/2022 1007 Last data filed at 10/12/2022 1830 Gross per 24 hour  Intake 720 ml  Output 675 ml  Net 45 ml   Filed Weights   10/11/22 0300 10/12/22 0425 10/13/22 0500  Weight: 95.7 kg 93.8 kg 95 kg    Examination:  General exam: Appears calm and comfortable  Respiratory system: Clear to auscultation. Respiratory effort normal. Cardiovascular system: S1 & S2 heard, RRR.  Gastrointestinal system: Abdomen is soft Central nervous system: Alert and awake Extremities: No edema Skin: No significant lesions noted Psychiatry: Flat affect.    Data Reviewed: I have personally reviewed following labs and imaging studies  CBC: Recent Labs  Lab 10/10/22 1457 10/11/22 0431  WBC 7.3 9.6  HGB 10.8* 10.0*  HCT 36.9* 32.9*  MCV 86.4 84.4  PLT 152 174   Basic Metabolic Panel: Recent Labs  Lab 10/10/22 1457 10/11/22 0431 10/12/22 0316 10/13/22 0426  NA 134* 136 135 134*  K 4.9 4.4 4.4 4.4  CL 101 103 102 102   CO2 24 24 24 24   GLUCOSE 299* 176* 123* 86  BUN 33* 32* 33* 41*  CREATININE 1.45* 1.34* 1.18 1.11  CALCIUM 8.6* 8.5* 8.4* 8.5*   GFR: Estimated Creatinine Clearance: 69 mL/min (by C-G formula based on SCr of 1.11 mg/dL). Liver Function Tests: Recent Labs  Lab 10/13/22 0426  AST 17  ALT 21  ALKPHOS 93  BILITOT 1.0  PROT 6.6  ALBUMIN 3.3*   No results for input(s): "LIPASE", "AMYLASE" in the last 168 hours. No results for input(s): "AMMONIA" in the last 168 hours. Coagulation Profile: No results for input(s): "INR", "PROTIME" in the last 168 hours. Cardiac Enzymes: No results for input(s): "CKTOTAL", "CKMB", "CKMBINDEX", "TROPONINI" in the last 168 hours. BNP (last 3 results) No results for input(s): "PROBNP" in the last 8760 hours. HbA1C: Recent Labs    10/11/22 0431  HGBA1C 7.8*   CBG: Recent Labs  Lab 10/12/22 0719 10/12/22 1125 10/12/22 1632 10/12/22 2130 10/13/22 0742  GLUCAP 127* 129* 169* 153* 87   Lipid Profile: No results for input(s): "CHOL", "HDL", "LDLCALC", "TRIG", "CHOLHDL", "LDLDIRECT" in the last 72 hours. Thyroid Function Tests: No results for input(s): "TSH", "T4TOTAL", "FREET4", "T3FREE", "THYROIDAB" in the last 72 hours. Anemia Panel: No results for input(s): "VITAMINB12", "FOLATE", "FERRITIN", "TIBC", "IRON", "RETICCTPCT" in the last 72 hours. Sepsis Labs: No results for input(s): "PROCALCITON", "LATICACIDVEN" in the last 168 hours.  Recent Results (from the past 240 hour(s))  MRSA Next Gen by PCR, Nasal     Status: None   Collection Time: 10/11/22  2:01 AM   Specimen: Nasal Mucosa; Nasal Swab  Result Value Ref Range Status   MRSA by PCR Next Gen NOT DETECTED NOT DETECTED Final    Comment: (NOTE) The GeneXpert MRSA Assay (FDA approved for NASAL specimens only), is one component of a comprehensive MRSA colonization surveillance program. It is not intended to diagnose MRSA infection nor to guide or monitor treatment for MRSA  infections. Test performance is not FDA approved in patients less than 97 years old. Performed at [facility], [address] [place], [place] [address]          Radiology Studies: ECHOCARDIOGRAM COMPLETE  Result Date: 10/11/2022    ECHOCARDIOGRAM REPORT   Patient Name:   Samuel Santos Date of Exam: 10/11/2022 Medical Rec #:  [id number]   Height:       67.0 in Accession #:    [id number]  Weight:       211.0 lb Date of Birth:  1951/02/24    BSA:          2.069 m Patient Age:    71 years    BP:           176/87 mmHg Patient Gender: M           HR:           61 bpm. [facility] Procedure: 2D Echo, Cardiac Doppler, Color Doppler and Intracardiac            Opacification Agent Indications:    I50.40* Unspecified combined systolic (congestive) and diastolic                 (  congestive) heart failure  History:        Patient has no prior history of Echocardiogram examinations.                 CHF, Stroke; Risk Factors:Diabetes and Hypertension.  Sonographer:    Sheralyn Boatman RDCS Referring Phys: [phone] JACOB J STINSON  Sonographer Comments: Technically difficult study due to poor echo windows, suboptimal parasternal window and suboptimal apical window. "Very difficult study due to patient left arm restricted mobility. Patient talking and moving throughout study. Patient grabbed my hand when doing Definity." IMPRESSIONS  1. Left ventricular ejection fraction, by estimation, is 55 to 60%. The left ventricle has normal function. Left ventricular endocardial border not optimally defined to evaluate regional wall motion. Left ventricular diastolic parameters are consistent with Grade II diastolic dysfunction (pseudonormalization).  2. Right ventricular systolic function is mildly reduced. The right ventricular size is mild-moderately enlarged. There is severely elevated pulmonary artery systolic pressure. The estimated right ventricular systolic pressure is 66.6 mmHg.  3. The mitral valve is grossly normal. No  evidence of mitral valve regurgitation. No evidence of mitral stenosis.  4. The aortic valve is tricuspid. There is mild calcification of the aortic valve. There is mild thickening of the aortic valve. Aortic valve regurgitation is not visualized. No aortic stenosis is present.  5. The inferior vena cava is dilated in size with <50% respiratory variability, suggesting right atrial pressure of 15 mmHg. FINDINGS  Left Ventricle: Left ventricular ejection fraction, by estimation, is 55 to 60%. The left ventricle has normal function. Left ventricular endocardial border not optimally defined to evaluate regional wall motion. Definity contrast agent was given IV to delineate the left ventricular endocardial borders. The left ventricular internal cavity size was normal in size. There is no left ventricular hypertrophy. Left ventricular diastolic parameters are consistent with Grade II diastolic dysfunction (pseudonormalization). Right Ventricle: The right ventricular size is mild-moderately enlarged. No increase in right ventricular wall thickness. Right ventricular systolic function is mildly reduced. There is severely elevated pulmonary artery systolic pressure. The tricuspid regurgitant velocity is 3.59 m/s, and with an assumed right atrial pressure of 15 mmHg, the estimated right ventricular systolic pressure is 66.6 mmHg. Left Atrium: Left atrial size was normal in size. Right Atrium: Right atrial size was normal in size. Pericardium: There is no evidence of pericardial effusion. Mitral Valve: The mitral valve is grossly normal. No evidence of mitral valve regurgitation. No evidence of mitral valve stenosis. Tricuspid Valve: The tricuspid valve is normal in structure. Tricuspid valve regurgitation is mild . No evidence of tricuspid stenosis. Aortic Valve: The aortic valve is tricuspid. There is mild calcification of the aortic valve. There is mild thickening of the aortic valve. Aortic valve regurgitation is not  visualized. No aortic stenosis is present. Pulmonic Valve: The pulmonic valve was not well visualized. Pulmonic valve regurgitation is not visualized. No evidence of pulmonic stenosis. Aorta: The aortic root is normal in size and structure. Ascending aorta measurements are within normal limits for age when indexed to body surface area. Venous: The inferior vena cava is dilated in size with less than 50% respiratory variability, suggesting right atrial pressure of 15 mmHg. IAS/Shunts: No atrial level shunt detected by color flow Doppler.  LEFT VENTRICLE PLAX 2D LVIDd:         5.70 cm   Diastology LVIDs:         4.40 cm   LV e' medial:    4.73 cm/s LV PW:  1.00 cm   LV E/e' medial:  18.0 LV IVS:        1.00 cm   LV e' lateral:   4.88 cm/s LVOT diam:     2.50 cm   LV E/e' lateral: 17.5 LV SV:         85 LV SV Index:   41 LVOT Area:     4.91 cm  RIGHT VENTRICLE            IVC RV S prime:     6.98 cm/s  IVC diam: 2.90 cm TAPSE (M-mode): 2.1 cm LEFT ATRIUM             Index        RIGHT ATRIUM           Index LA diam:        4.30 cm 2.08 cm/m   RA Area:     16.10 cm LA Vol (A2C):   36.0 ml 17.40 ml/m  RA Volume:   37.90 ml  18.32 ml/m LA Vol (A4C):   38.9 ml 18.80 ml/m LA Biplane Vol: 40.5 ml 19.58 ml/m  AORTIC VALVE LVOT Vmax:   83.10 cm/s LVOT Vmean:  52.700 cm/s LVOT VTI:    0.173 m  AORTA Ao Root diam: 3.50 cm Ao Asc diam:  3.80 cm MITRAL VALVE               TRICUSPID VALVE MV Area (PHT): 3.68 cm    TR Peak grad:   51.6 mmHg MV Decel Time: 206 msec    TR Vmax:        359.00 cm/s MV E velocity: 85.25 cm/s MV A velocity: 52.70 cm/s  SHUNTS MV E/A ratio:  1.62        Systemic VTI:  0.17 m                            Systemic Diam: 2.50 cm Weston Brass MD Electronically signed by Weston Brass MD Signature Date/Time: 10/11/2022/2:47:56 PM    Final         Scheduled Meds:  amLODipine  10 mg Oral Daily   enoxaparin (LOVENOX) injection  40 mg Subcutaneous Q24H   furosemide  40 mg Oral BID    insulin aspart  0-20 Units Subcutaneous TID WC   insulin aspart  0-5 Units Subcutaneous QHS   insulin glargine-yfgn  20 Units Subcutaneous QHS   metoprolol tartrate  100 mg Oral BID   pantoprazole  40 mg Oral Q1200   potassium chloride  20 mEq Oral BID     LOS: 3 days    Time spent: 35 minutes    Naysha Sholl Hoover Brunette, DO [facility]  If 7PM-7AM, please contact night-coverage [email or web address] 10/13/2022, 10:07 AM ]

## 2022-10-13 NOTE — Care Management Important Message (Signed)
Important Message  Patient Details  Name: Samuel Santos MRN: [id number] Date of Birth: [birth year]   Medicare Important Message Given:  Yes     Corey Harold 10/13/2022, 1:49 PM

## 2022-10-13 NOTE — Consult Note (Cosign Needed)
Cardiology Consultation   Patient ID: English Craighead MRN: [id number]; DOB: 1950-11-16  Admit date: 10/10/2022 Date of Consult: 10/13/2022  PCP:  No primary care provider on file.   [facility] Providers Cardiologist:  None        Patient Profile:   Abijah Roussel is a 72 y.o. male with a hx of DM, HTN, stroke, hyperlipidemia by labs, contrast allergy, ?cardiac arrest 02/2022 who is being seen 10/13/2022 for the evaluation of CHF and chest pain at the request of Dr. Sherryll Burger.  History of Present Illness:   Mr. Arnott reports a remote cath 15-16 years ago that he reports did not find anything. Per chart review, was seen in our system 02/2022 with chest pain, mildly elevated troponin (20->22), elevated blood sugar and LDL - admission recommended but patient declined. He reports that month he was then admitted to [facility] for cardiac arrest with very unclear details. He recalls calling EMS for chest pain then was told he had a cardiac arrest by the time they got here. He had CPR and broke some ribs but does not remember much else than that. He does not recall having any cardiac testing whatsoever and states he was there for 2-3 days. We do not have any records available but have requested them.   He presented this admission with several-day history of atypical chest pain and SOB. He noticed the CP in the center of his chest, predominantly related to certain positions in bed, resolved lying on his right side. He also noticed worsening SOB, DOE, LE edema and orthopnea. He denies excess sodium but drinks a lot of fluids. At [facility], was found to have hypertensive emergency (ER BP 197/104), AKI with Cr of 1.45 (previously 0.7), BNP 1016, elevated troponin low/flat (peak 31), A1c 7.8. CXR NAD. 2D echo was very difficult study due to patient left arm restricted mobility, patient movement and talking during study, patient grabbed tech hand while doing definity  - showed EF 55-60%, G2DD, severely  elevated PASP , dilated IVC. He has been treated with IV lasix primarily  IV BID since admission with improvement of AKI to Cr 1.11. SBP variable but predominantly hypertensive. I/O's not impressive but weight 212->210->206->209. He reports feeling much, much better. He can still feel minor chest pain lingering (again resolved lying on right side) but SOB has been improving. He does not know if he snores and denies prior eval for OSA.   Past Medical History:  Diagnosis Date   Cardiac arrest    Contrast media allergy    Diabetes mellitus without complication    Hypertension    Stroke     Past Surgical History:  Procedure Laterality Date   APPENDECTOMY     FRACTURE SURGERY     JOINT REPLACEMENT       Home Medications:  Prior to Admission medications   Medication Sig Start Date End Date Taking? Authorizing Provider  atenolol (TENORMIN) 50 MG tablet Take 50 mg by mouth daily.   Yes [provider]  JARDIANCE 25 MG TABS tablet Take 12.5 mg by mouth daily. 07/03/22  Yes [provider]  nitroGLYCERIN (NITROSTAT) 0.4 MG SL tablet Place 1 tablet (0.4 mg total) under the tongue every 5 (five) minutes as needed for chest pain. Up to 3 doses per day 02/21/22  Yes Trifan, Kermit Balo, MD  amoxicillin (AMOXIL) 500 MG capsule Take 1 capsule (500 mg total) by mouth 3 (three) times daily. Patient not taking: Reported on 10/11/2022 06/16/21  Bethann Berkshire, MD  atenolol (TENORMIN) 50 MG tablet Take 1 tablet (50 mg total) by mouth daily. 02/21/22 03/23/22  Terald Sleeper, MD  oxyCODONE-acetaminophen (PERCOCET) 5-325 MG tablet Take 1 tablet by mouth every 6 (six) hours as needed for up to 20 doses for moderate pain. Patient not taking: Reported on 10/11/2022 06/16/21   Bethann Berkshire, MD    Inpatient Medications: Scheduled Meds:  amLODipine  10 mg Oral Daily   enoxaparin (LOVENOX) injection  40 mg Subcutaneous Q24H   furosemide  40 mg Oral BID   insulin aspart  0-20 Units  Subcutaneous TID WC   insulin aspart  0-5 Units Subcutaneous QHS   insulin glargine-yfgn  20 Units Subcutaneous QHS   metoprolol tartrate  100 mg Oral BID   pantoprazole  40 mg Oral Q1200   potassium chloride  20 mEq Oral BID   Continuous Infusions:  PRN Meds: acetaminophen, hydrALAZINE, ondansetron **OR** ondansetron (ZOFRAN) IV  Allergies:    Allergies  Allergen Reactions   Bee Venom Anaphylaxis   Contrast Media [Iodinated Contrast Media] Anaphylaxis   Naproxen Hives    Social History:   Social History   Socioeconomic History   Marital status: Legally Separated    Spouse name: Not on file   Number of children: Not on file   Years of education: Not on file   Highest education level: Not on file  Occupational History   Not on file  Tobacco Use   Smoking status: Former    Types: E-cigarettes   Smokeless tobacco: Never  Vaping Use   Vaping Use: Never used  Substance and Sexual Activity   Alcohol use: No   Drug use: No   Sexual activity: Not on file  Other Topics Concern   Not on file  Social History Narrative   Not on file   Social Determinants of Health   Financial Resource Strain: Not on file  Food Insecurity: No Food Insecurity (10/11/2022)   Hunger Vital Sign    Worried About Running Out of Food in the Last Year: Never true    Ran Out of Food in the Last Year: Never true  Transportation Needs: No Transportation Needs (10/11/2022)   PRAPARE - Administrator, Civil Service (Medical): No    Lack of Transportation (Non-Medical): No  Physical Activity: Not on file  Stress: Not on file  Social Connections: Not on file  Intimate Partner Violence: Not At Risk (10/11/2022)   Humiliation, Afraid, Rape, and Kick questionnaire    Fear of Current or Ex-Partner: No    Emotionally Abused: No    Physically Abused: No    Sexually Abused: No    Family History:    Family History  Problem Relation Age of Onset   Diabetes Father    Heart attack Other       ROS:  Please see the history of present illness.  All other ROS reviewed and negative.     Physical Exam/Data:   Vitals:   10/12/22 1852 10/12/22 2001 10/13/22 0418 10/13/22 0500  BP: (!) 166/81 (!) 170/73 (!) 165/93   Pulse: 66 65 (!) 59   Resp: 18  20   Temp:  98.3 F (36.8 C) 98.6 F (37 C)   TempSrc:  Oral Oral   SpO2: 99% 100% 98%   Weight:    95 kg  Height:        Intake/Output Summary (Last 24 hours) at 10/13/2022 1151 Last data filed at 10/13/2022 1015  Gross per 24 hour  Intake 960 ml  Output 675 ml  Net 285 ml      10/13/2022    5:00 AM 10/12/2022    4:25 AM 10/11/2022    3:00 AM  Last 3 Weights  Weight (lbs) 209 lb 7 oz 206 lb 12.7 oz 210 lb 15.7 oz  Weight (kg) 95 kg 93.8 kg 95.7 kg     Body mass index is 32.8 kg/m.  General: Well developed, well nourished, in no acute distress. Head: Normocephalic, atraumatic, sclera non-icteric, no xanthomas, nares are without discharge. Neck: Negative for carotid bruits. JVP difficult to assess given thick neck habitus Lungs: Clear bilaterally to auscultation without wheezes, rales, or rhonchi. Breathing is unlabored. Heart: RRR S1 S2 without murmurs, rubs, or gallops.  Abdomen: Soft, non-tender, non-distended with normoactive bowel sounds. No rebound/guarding. Extremities: No clubbing or cyanosis. No edema. Distal pedal pulses are 2+ and equal bilaterally. Neuro: Alert and oriented X 3. Moves all extremities spontaneously. Psych:  Responds to questions appropriately with a normal affect.   EKG:  The EKG was personally reviewed and demonstrates:   Initial NSR 72bpm, baseline wander making interpretation challenging, nonspecific STTW changes F/u yesterday: NSR 68bpm, baseline wander making interpretation challenging, nonspecific STTW changes Today: SB 59bpm nonspecific STTW changes QTC  Telemetry:  Telemetry was personally reviewed and demonstrates:  NSR  Relevant CV Studies: 2d Echo 10/11/22   1. Left  ventricular ejection fraction, by estimation, is 55 to 60%. The  left ventricle has normal function. Left ventricular endocardial border  not optimally defined to evaluate regional wall motion. Left ventricular  diastolic parameters are consistent  with Grade II diastolic dysfunction (pseudonormalization).   2. Right ventricular systolic function is mildly reduced. The right  ventricular size is mild-moderately enlarged. There is severely elevated  pulmonary artery systolic pressure. The estimated right ventricular  systolic pressure is 66.6 mmHg.   3. The mitral valve is grossly normal. No evidence of mitral valve  regurgitation. No evidence of mitral stenosis.   4. The aortic valve is tricuspid. There is mild calcification of the  aortic valve. There is mild thickening of the aortic valve. Aortic valve  regurgitation is not visualized. No aortic stenosis is present.   5. The inferior vena cava is dilated in size with <50% respiratory  variability, suggesting right atrial pressure of 15 mmHg.    Laboratory Data:  High Sensitivity Troponin:   Recent Labs  Lab 10/10/22 1457 10/10/22 1646 10/12/22 0926 10/12/22 1119  TROPONINIHS 22* 22* 29* 31*     Chemistry Recent Labs  Lab 10/11/22 0431 10/12/22 0316 10/13/22 0426  NA 136 135 134*  K 4.4 4.4 4.4  CL 103 102 102  CO2 GLUCOSE 176* 123* 86  BUN 32* 33* 41*  CREATININE 1.34* 1.18 1.11  CALCIUM 8.5* 8.4* 8.5*  GFRNONAA 57* >60 >60  ANIONGAP Recent Labs  Lab 10/13/22 0426  PROT 6.6  ALBUMIN 3.3*  AST 17  ALT 21  ALKPHOS 93  BILITOT 1.0   Lipids No results for input(s): "CHOL", "TRIG", "HDL", "LABVLDL", "LDLCALC", "CHOLHDL" in the last 168 hours.  Hematology Recent Labs  Lab 10/10/22 1457 10/11/22 0431  WBC 7.3 9.6  RBC 4.27 3.90*  HGB 10.8* 10.0*  HCT 36.9* 32.9*  MCV 86.4 84.4  MCH 25.3* 25.6*  MCHC 29.3* 30.4  RDW 16.9* 17.0*  PLT 152 174   Thyroid No results for input(s): "TSH",  "  FREET4" in the last 168 hours.  BNP Recent Labs  Lab 10/10/22 1515  BNP 1,016.0*    DDimer No results for input(s): "DDIMER" in the last 168 hours.   Radiology/Studies:  ECHOCARDIOGRAM COMPLETE  Result Date: 10/11/2022    ECHOCARDIOGRAM REPORT   Patient Name:   ASMAR Varricchio Date of Exam: 10/11/2022 Medical Rec #:  [id number]   Height:       67.0 in Accession #:    [id number]  Weight:       211.0 lb Date of Birth:  05/22/1951    BSA:          2.069 m Patient Age:    71 years    BP:           176/87 mmHg Patient Gender: M           HR:           61 bpm. [facility] Procedure: 2D Echo, Cardiac Doppler, Color Doppler and Intracardiac            Opacification Agent Indications:    I50.40* Unspecified combined systolic (congestive) and diastolic                 (congestive) heart failure  History:        Patient has no prior history of Echocardiogram examinations.                 CHF, Stroke; Risk Factors:Diabetes and Hypertension.  Sonographer:    Sheralyn Boatman RDCS Referring Phys: [phone] JACOB J STINSON  Sonographer Comments: Technically difficult study due to poor echo windows, suboptimal parasternal window and suboptimal apical window. "Very difficult study due to patient left arm restricted mobility. Patient talking and moving throughout study. Patient grabbed my hand when doing Definity." IMPRESSIONS  1. Left ventricular ejection fraction, by estimation, is 55 to 60%. The left ventricle has normal function. Left ventricular endocardial border not optimally defined to evaluate regional wall motion. Left ventricular diastolic parameters are consistent with Grade II diastolic dysfunction (pseudonormalization).  2. Right ventricular systolic function is mildly reduced. The right ventricular size is mild-moderately enlarged. There is severely elevated pulmonary artery systolic pressure. The estimated right ventricular systolic pressure is 66.6 mmHg.  3. The mitral valve is grossly normal. No evidence  of mitral valve regurgitation. No evidence of mitral stenosis.  4. The aortic valve is tricuspid. There is mild calcification of the aortic valve. There is mild thickening of the aortic valve. Aortic valve regurgitation is not visualized. No aortic stenosis is present.  5. The inferior vena cava is dilated in size with <50% respiratory variability, suggesting right atrial pressure of 15 mmHg. FINDINGS  Left Ventricle: Left ventricular ejection fraction, by estimation, is 55 to 60%. The left ventricle has normal function. Left ventricular endocardial border not optimally defined to evaluate regional wall motion. Definity contrast agent was given IV to delineate the left ventricular endocardial borders. The left ventricular internal cavity size was normal in size. There is no left ventricular hypertrophy. Left ventricular diastolic parameters are consistent with Grade II diastolic dysfunction (pseudonormalization). Right Ventricle: The right ventricular size is mild-moderately enlarged. No increase in right ventricular wall thickness. Right ventricular systolic function is mildly reduced. There is severely elevated pulmonary artery systolic pressure. The tricuspid regurgitant velocity is 3.59 m/s, and with an assumed right atrial pressure of 15 mmHg, the estimated right ventricular systolic pressure is 66.6 mmHg. Left Atrium: Left atrial size was normal in size.  Right Atrium: Right atrial size was normal in size. Pericardium: There is no evidence of pericardial effusion. Mitral Valve: The mitral valve is grossly normal. No evidence of mitral valve regurgitation. No evidence of mitral valve stenosis. Tricuspid Valve: The tricuspid valve is normal in structure. Tricuspid valve regurgitation is mild . No evidence of tricuspid stenosis. Aortic Valve: The aortic valve is tricuspid. There is mild calcification of the aortic valve. There is mild thickening of the aortic valve. Aortic valve regurgitation is not visualized. No  aortic stenosis is present. Pulmonic Valve: The pulmonic valve was not well visualized. Pulmonic valve regurgitation is not visualized. No evidence of pulmonic stenosis. Aorta: The aortic root is normal in size and structure. Ascending aorta measurements are within normal limits for age when indexed to body surface area. Venous: The inferior vena cava is dilated in size with less than 50% respiratory variability, suggesting right atrial pressure of 15 mmHg. IAS/Shunts: No atrial level shunt detected by color flow Doppler.  LEFT VENTRICLE PLAX 2D LVIDd:         5.70 cm   Diastology LVIDs:         4.40 cm   LV e' medial:    4.73 cm/s LV PW:         1.00 cm   LV E/e' medial:  18.0 LV IVS:        1.00 cm   LV e' lateral:   4.88 cm/s LVOT diam:     2.50 cm   LV E/e' lateral: 17.5 LV SV:         85 LV SV Index:   41 LVOT Area:     4.91 cm  RIGHT VENTRICLE            IVC RV S prime:     6.98 cm/s  IVC diam: 2.90 cm TAPSE (M-mode): 2.1 cm LEFT ATRIUM             Index        RIGHT ATRIUM           Index LA diam:        4.30 cm 2.08 cm/m   RA Area:     16.10 cm LA Vol (A2C):   36.0 ml 17.40 ml/m  RA Volume:   37.90 ml  18.32 ml/m LA Vol (A4C):   38.9 ml 18.80 ml/m LA Biplane Vol: 40.5 ml 19.58 ml/m  AORTIC VALVE LVOT Vmax:   83.10 cm/s LVOT Vmean:  52.700 cm/s LVOT VTI:    0.173 m  AORTA Ao Root diam: 3.50 cm Ao Asc diam:  3.80 cm MITRAL VALVE               TRICUSPID VALVE MV Area (PHT): 3.68 cm    TR Peak grad:   51.6 mmHg MV Decel Time: 206 msec    TR Vmax:        359.00 cm/s MV E velocity: 85.25 cm/s MV A velocity: 52.70 cm/s  SHUNTS MV E/A ratio:  1.62        Systemic VTI:  0.17 m                            Systemic Diam: 2.50 cm Weston Brass MD Electronically signed by Weston Brass MD Signature Date/Time: 10/11/2022/2:47:56 PM    Final    DG Chest Port 1 View  Result Date: 10/10/2022 CLINICAL DATA:  Shortness of breath. EXAM: PORTABLE CHEST 1 VIEW COMPARISON:  02/21/2022. FINDINGS: Rotated patient. Low  lung volumes accentuate the pulmonary vasculature and cardiomediastinal silhouette. No consolidation or edema. No pleural effusion or pneumothorax. IMPRESSION: No evidence of acute cardiopulmonary disease. Electronically Signed   By: Orvan Falconer M.D.   On: 10/10/2022 15:30     Assessment and Plan:   1. Acute HFpEF with hypertensive emergency - patient reports improving symptoms with diuresis - not yet back to baseline but feeling much better - ?driven by elevation of blood pressure - continue IV Lasix - otherwise currently on amlodipine 10mg  daily, metoprolol 100mg  BID (in place of home atenolol) -> BP remains high so would consider transitioning metoprolol to carvedilol; will otherwise review med rx with MD  2. Atypical chest pain with mildly elevated troponins, ?h/o cardiac arrest - have placed order to request records from [facility] 02/2022 event - patient poor historian - CP is quite atypical and he has had only minor troponin trend, with improvement of symptoms as he has been diuresed, but if truly had cardiac arrest without workup, may warrant ischemic evaluation (note allergy to contrast, naproxen)  3. Severe pulmonary HTN - ? Related to CHF, PE seems less likely given not hypoxic, not tachycardic, and symptoms improving with diuresis - recommend eval for OSA as outpatient - will otherwise review with Dr. Jenene Slicker  4. AKI - continues to improve, not quite yet back to baseline  5. Hyperlipidemia  - check lipid panel in AM and consider adding statin  6. DM - per medicine team   Risk Assessment/Risk Scores:        New York Heart Association (NYHA) Functional Class NYHA Class III-IV on arrival  For questions or updates, please contact [facility] Please consult [email or web address] for contact info under    Signed, Laurann Montana, PA-C  10/13/2022 11:51 AM    Attending attestation  Patient seen and independently examined with Ronie Spies, PA-C. We discussed  all aspects of the encounter. I agree with the assessment and plan as stated above.  Patient is a 72 year old M known to have HTN, DM 2, history of CVA, HLD, questionable cardiac arrest in 02/2022 is currently admitted to the hospitalist team for the management of acute HFpEF and chest pain. High sensitivity troponins were mildly elevated, 22, 22, 29, 31.  EKG showed sinus bradycardia, no ST changes.  Patient reported having questionable cardiac arrest in 02/2022 when he was taken to [facility] and eventually he left AMA.  He said he was partially awake when they tried to do CPR.  He also underwent LHC around 20 years ago in [place] where he was told he had no blockages. He reported having chest pain since having questionable cardiac arrest in 02/2022, this chest pains occur especially when he lays on his back or on his right side and resolves when he stands up.  His METs are more than 4, can perform his daily activities, household chores and mows lawn. He does not have any chest pain with exertion when he performs the above activities. He thinks his chest pains are coming from stress and also from his stroke. Frequency has been 1-2 times per week and lasting for 30 minutes. Physical examination is remarkable for patient not in acute respite distress, HEENT normal, no JVD, S1 S2 is normal, no murmur, lungs, nondistended and nontender abdomen and no pitting edema in bilateral lower extremities.  Echocardiogram showed normal LVEF, severely elevated PASP with RV enlargement.  # Acute HFpEF, compensated: Initially was on  IV Lasix 40 mg twice daily, adequately diuresed and switch to p.o. Lasix 40 mg twice daily couple of days ago. He will benefit from outpatient exercise Myoview. # Myocardial injury with no evidence of myocardial infarction: Likely secondary to volume overload. # Noncardiac chest pain: Patient has chest pain with lying down and resolves with standing up. No chest pain  with exertion. He underwent LHC around 20 years ago in [place] and was told he did not have any blockages.  I doubt he had a true cardiac arrest in 02/2022 as he said he was partially awake when they were doing CPR. Will try to get records from [facility].  # Questionable cardiac arrest 02/2022: Patient was partially awake when they were doing CPR. I doubt if he is a true cardiac arrest, will get records from [facility]. Follow-up in cardiology clinic in 1 month upon discharge. Patient can get records in the outpatient setting as well. # History of CVA: Unclear if he had a true CVA.  Will defer aspirin and high intensity statin until we get more info/records. # Pulm HTN: Likely secondary to HFpEF exacerbation.  Can get a repeat limited echocardiogram in outpatient setting  [facility] will sign off.   Medication Recommendations: Lasix 40 mg twice daily, amlodipine 10 mg once daily, carvedilol 12.5 mg twice daily (discontinue atenolol, home dose upon discharge) Other recommendations (labs, testing, etc): Exercise Myoview and limited echocardiogram for PASP in 1 month upon discharge Follow up as an outpatient: Cardiology follow-up in 1 month upon discharge  I have spent a total of 60 minutes with patient reviewing chart , telemetry, EKGs, labs and examining patient as well as establishing an assessment and plan that was discussed with the patient.  > 50% of time was spent in direct patient care.     Vishnu Verne Spurr, MD [place]  [facility]  10:51 AM

## 2022-10-13 NOTE — TOC Transition Note (Addendum)
Transition of Care [facility]) - CM/SW Discharge Note   Patient Details  Name: Samuel Santos MRN: [id number] Date of Birth: 1951/05/15  [facility] CM/SW Contact:  Leitha Bleak, RN Phone Number: 10/13/2022, 11:04 AM   Clinical Narrative:   Patient Admitted with acute exacerbation of CHF.  TOC consulted for CHF education. Patient states he has not had any education one CHF. He is open and agreeable home health coming in to help him understand CHF. He walks with a walker, needing some PT as well. As he plans to continue to mow his grass this year.  MD aware to order. Sarah with [facility] will check with [facility] to see if they will accept.   Patient states he goes to [facility] in [place], [place] for PCP  Addendum : patient discharging home today. Sarah updated, orders have been placed.    Final next level of care: Home w Home Health Services Barriers to Discharge: Continued Medical Work up   Patient Goals and CMS Choice CMS Medicare.gov Compare Post Acute Care list provided to:: Patient Choice offered to / list presented to : Patient  Discharge Placement       Discharge Plan and Services Additional resources added to the After Visit Summary for        [facility] Arranged: PT, RN   Date [facility] Agency Contacted: 10/13/22 Time HH Agency Contacted: 1102 Representative spoke with at [facility] Agency: [employer] copy)  for [employer].  Social Determinants of Health (SDOH) Interventions SDOH Screenings   Food Insecurity: No Food Insecurity (10/11/2022)  Housing: Low Risk  (10/11/2022)  Transportation Needs: No Transportation Needs (10/11/2022)  Utilities: Not At Risk (10/11/2022)  Tobacco Use: Medium Risk (10/10/2022)    Readmission Risk Interventions    10/13/2022   11:04 AM  Readmission Risk Prevention Plan  Post Dischage Appt Not Complete  Medication Screening Complete  Transportation Screening Complete

## 2022-10-14 ENCOUNTER — Encounter: Payer: Self-pay | Admitting: *Deleted

## 2022-10-14 ENCOUNTER — Other Ambulatory Visit: Payer: Self-pay | Admitting: *Deleted

## 2022-10-14 ENCOUNTER — Encounter: Payer: Self-pay | Admitting: Internal Medicine

## 2022-10-14 ENCOUNTER — Telehealth: Payer: Self-pay | Admitting: Physician Assistant

## 2022-10-14 DIAGNOSIS — I272 Pulmonary hypertension, unspecified: Secondary | ICD-10-CM

## 2022-10-14 DIAGNOSIS — I5033 Acute on chronic diastolic (congestive) heart failure: Secondary | ICD-10-CM

## 2022-10-14 DIAGNOSIS — R0789 Other chest pain: Secondary | ICD-10-CM

## 2022-10-14 DIAGNOSIS — I509 Heart failure, unspecified: Secondary | ICD-10-CM

## 2022-10-14 DIAGNOSIS — Z8674 Personal history of sudden cardiac arrest: Secondary | ICD-10-CM

## 2022-10-14 DIAGNOSIS — I5031 Acute diastolic (congestive) heart failure: Secondary | ICD-10-CM

## 2022-10-14 DIAGNOSIS — I5A Non-ischemic myocardial injury (non-traumatic): Secondary | ICD-10-CM

## 2022-10-14 DIAGNOSIS — R079 Chest pain, unspecified: Secondary | ICD-10-CM

## 2022-10-14 LAB — BASIC METABOLIC PANEL
Anion gap: 7 (ref 5–15)
BUN: 39 mg/dL — ABNORMAL HIGH (ref 8–23)
CO2: 25 mmol/L (ref 22–32)
Calcium: 8.5 mg/dL — ABNORMAL LOW (ref 8.9–10.3)
Chloride: 102 mmol/L (ref 98–111)
Creatinine, Ser: 1.15 mg/dL (ref 0.61–1.24)
GFR, Estimated: >60 mL/min (ref >60)
Glucose, Bld: 86 mg/dL (ref 70–99)
Potassium: 4.5 mmol/L (ref 3.5–5.1)
Sodium: 134 mmol/L — ABNORMAL LOW (ref 135–145)

## 2022-10-14 LAB — CBC
HCT: 34.2 % — ABNORMAL LOW (ref 39.0–52.0)
Hemoglobin: 10.3 g/dL — ABNORMAL LOW (ref 13.0–17.0)
MCH: 25.6 pg — ABNORMAL LOW (ref 26.0–34.0)
MCHC: 30.1 g/dL (ref 30.0–36.0)
MCV: 85.1 fL (ref 80.0–100.0)
Platelets: 173 10*3/uL (ref 150–400)
RBC: 4.02 MIL/uL — ABNORMAL LOW (ref 4.22–5.81)
RDW: 16.8 % — ABNORMAL HIGH (ref 11.5–15.5)
WBC: 7.1 10*3/uL (ref 4.0–10.5)
nRBC: 0 % (ref 0.0–0.2)

## 2022-10-14 LAB — GLUCOSE, CAPILLARY
Glucose-Capillary: 86 mg/dL (ref 70–99)
Glucose-Capillary: 215 mg/dL — ABNORMAL HIGH (ref 70–99)

## 2022-10-14 LAB — LIPID PANEL
Cholesterol: 140 mg/dL (ref 0–200)
HDL: 22 mg/dL — ABNORMAL LOW (ref >40)
LDL Cholesterol: 99 mg/dL (ref 0–99)
Total CHOL/HDL Ratio: 6.4 RATIO
Triglycerides: 97 mg/dL (ref <150)
VLDL: 19 mg/dL (ref 0–40)

## 2022-10-14 LAB — MAGNESIUM: Magnesium: 2 mg/dL (ref 1.7–2.4)

## 2022-10-14 MED ORDER — CARVEDILOL 12.5 MG PO TABS: 12.5000 mg | ORAL_TABLET | Freq: Two times a day (BID) | ORAL | 0 refills | Status: AC

## 2022-10-14 MED ORDER — FUROSEMIDE 40 MG PO TABS: 40.0000 mg | ORAL_TABLET | Freq: Two times a day (BID) | ORAL | 0 refills | Status: AC

## 2022-10-14 MED ORDER — PANTOPRAZOLE SODIUM 40 MG PO TBEC: 40.0000 mg | DELAYED_RELEASE_TABLET | Freq: Every day | ORAL | 0 refills | Status: AC

## 2022-10-14 MED ORDER — AMLODIPINE BESYLATE 10 MG PO TABS: 10.0000 mg | ORAL_TABLET | Freq: Every day | ORAL | 0 refills | Status: AC

## 2022-10-14 MED ORDER — BLOOD GLUCOSE TEST VI STRP: 1.0000 | ORAL_STRIP | Freq: Three times a day (TID) | 0 refills | Status: AC

## 2022-10-14 MED ORDER — LANCET DEVICE MISC: 1.0000 | Freq: Three times a day (TID) | 0 refills | Status: AC

## 2022-10-14 MED ORDER — LANCETS MISC. MISC: 1.0000 | Freq: Three times a day (TID) | 0 refills | Status: AC

## 2022-10-14 MED ORDER — BLOOD GLUCOSE MONITORING SUPPL DEVI: 1.0000 | Freq: Three times a day (TID) | 0 refills | Status: AC

## 2022-10-14 MED ORDER — POTASSIUM CHLORIDE CRYS ER 20 MEQ PO TBCR: 20.0000 meq | EXTENDED_RELEASE_TABLET | Freq: Two times a day (BID) | ORAL | 0 refills | Status: AC

## 2022-10-14 MED ORDER — METFORMIN HCL 500 MG PO TABS: 500.0000 mg | ORAL_TABLET | Freq: Two times a day (BID) | ORAL | 0 refills | Status: AC

## 2022-10-14 NOTE — Telephone Encounter (Signed)
Note opened in error. Discussed f/u needs with staff and will outline on AVS.

## 2022-10-14 NOTE — Consult Note (Signed)
   [facility] Inpatient Consult   10/14/2022  Samuel Santos 1951/05/24 [id number]     Location: [facility] Liaison screened remotely [facility]).   [facility] Customer service manager [facility]) Accountable Care Organization [ACO] Patient: Chemical engineer)    Primary Care Provider:  [facility]   Patient screened for readmission hospitalization with noted low risk score for unplanned readmission risk with 1 IP in 6 months. [facility]/Population Health RN liaison will assess for potential [facility]) Care Management service needs for post hospital transition for care coordination.  Unsuccessful attempt to reach pt however able to leave a HIPAA voice message.  Plan: [facility]/Population Health RN Liaison will continue to follow ongoing disposition in assessing for post hospital community care coordination/management needs.  Referral request for community care coordination: for hospital prevention readmission and care management services related to disease management.    [facility] Care Management/Population Health does not replace or interfere with any arrangements made by the Inpatient Transition of Care team.   For questions contact:      Elliot Cousin, RN, BSN [facility] Liaison [place]   [facility]  Population Health Office Hours MTWF 8:00 am to 6 pm off on Thursday [phone] mobile [phone] [Office toll free line][facility] Office Hours are M-F 8:30 - 5 pm 24 hour nurse advise line [phone] Conceirge  [email or web address]

## 2022-10-14 NOTE — Progress Notes (Addendum)
Called into patient's room to go over plan for limited echocardiogram, stress test, and follow-up. Patient adamantly refuses stress test, saying "I'm not going through with that shit, it's not going to show you anything anyway." We discussed the limited echocardiogram. He states he did not understand why we had to do that when we already did one this admission. I went into detail about what was found during his stay and why Dr. Jenene Slicker recommended a repeat to follow up his pulmonary artery pressures. He stated, "nobody seems to know what's going on around here, y'all are just picking things out of the sky and then charging me money. All you are wanting to do is do all these tests so you can make money off of me." I explained the rationale behind the testing, what he was diagnosed with this admission, and reinforced that these recommendations were based on standard of care, not monetary benefit. At this time he is unsure he wants to pursue the echo either but per our discussion will leave the appointment as it stands on 5/23 with contact information to cancel if he chooses to do so. We also have arranged f/u with our office 12/04/22, asked office staff to ensure records available from [place] at that time. The details of his reported cardiac arrest/CPR remain unclear as patient is now unsure which hospital he had gone to and states he would have been better off being left behind a bush. Would also recommend PCP f/u within 1 week. Discussed with nurse to ensure AVS re-printed to omit the stress test date that has been cancelled.  Addendum: records have been received. As previously noted, patient had reported being admitted to [facility] for 2-3 days and reported they did nothing for him. 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 hsTroponin peak looked to only be 218. No cath pursued, started on Entresto and carvedilol, recommended to repeat echo in 6-8 weeks. Their notes also outline prior history hemorrhagic CVA. As above, this admission, EF normal. Relayed update to Dr. Jenene Slicker. Continue plan as outlined in notes. Gave records to [facility] front desk for scanning.

## 2022-10-14 NOTE — Discharge Summary (Addendum)
Physician Discharge Summary  Samuel Santos ZOX:[id number] DOB: 06/24/1950 DOA: 10/10/2022  PCP: No primary care provider on file.  Admit date: 10/10/2022  Discharge date: 10/14/2022  Admitted From:Home  Disposition:  Home  Recommendations for Outpatient Follow-up:  Follow up with PCP in 1-2 weeks Follow-up with cardiology as scheduled on 6/13 at 10:40 AM and remain on Lasix, amlodipine, and Coreg as prescribed Remain on Protonix as prescribed for atypical chest pain suspected to be related to GERD Remain on Jardiance and metformin added for improved blood glucose control with glucose meter prescribed Continue other home medications as prior  Home Health: None  Equipment/Devices: None  Discharge Condition:Stable  CODE STATUS: Full  Diet recommendation: Heart Healthy/carb modified  Brief/Interim Summary:  72 year old male with medical history of diabetes mellitus type 2, hypertension, cardiac arrest in September 2023.  History of stroke came with chest pain for past 3 days, intermittent.  Also having shortness of breath.  Described chest pain as constant pressure.  Complains of shortness of breath when laying flat. Found to have BNP 1016, mild anemia with hemoglobin 10.8.  Creatinine 1.45.  Echocardiogram ordered with noted grade 2 diastolic dysfunction and preserved LVEF.  Started on diuresis with Lasix which is now oral, but he continues to have ongoing chest pain and symptomatology as prior.  Cardiology, consulted to assist with evaluation and management.  He will remain on medications as prescribed below and follow-up with cardiology outpatient to consider stress testing upon further review of records.  He is in stable condition for discharge today with no other acute events or concerns noted.  Discharge Diagnoses:  Principal Problem:   Acute exacerbation of CHF (congestive heart failure) Active Problems:   Hypertensive emergency   History of stroke   Diabetes mellitus without  complication  Principal discharge diagnosis: Acute HFpEF with hypertensive emergency.  Atypical chest pain likely secondary to GERD.  Discharge Instructions  Discharge Instructions     Diet - low sodium heart healthy   Complete by: As directed    Increase activity slowly   Complete by: As directed       Allergies as of 10/14/2022       Reactions   Bee Venom Anaphylaxis   Contrast Media [iodinated Contrast Media] Anaphylaxis   Naproxen Hives        Medication List     STOP taking these medications    amoxicillin 500 MG capsule Commonly known as: AMOXIL   atenolol 50 MG tablet Commonly known as: TENORMIN   oxyCODONE-acetaminophen 5-325 MG tablet Commonly known as: Percocet       TAKE these medications    amLODipine 10 MG tablet Commonly known as: NORVASC Take 1 tablet (10 mg total) by mouth daily. Start taking on: October 15, 2022   Blood Glucose Monitoring Suppl Devi 1 each by Does not apply route in the morning, at noon, and at bedtime. May substitute to any manufacturer covered by patient's insurance.   BLOOD GLUCOSE TEST STRIPS Strp 1 each by In Vitro route in the morning, at noon, and at bedtime. May substitute to any manufacturer covered by patient's insurance.   carvedilol 12.5 MG tablet Commonly known as: COREG Take 1 tablet (12.5 mg total) by mouth 2 (two) times daily with a meal.   furosemide 40 MG tablet Commonly known as: LASIX Take 1 tablet (40 mg total) by mouth 2 (two) times daily.   Jardiance 25 MG Tabs tablet Generic drug: empagliflozin Take 12.5 mg by mouth daily.  Lancet Device Misc 1 each by Does not apply route in the morning, at noon, and at bedtime. May substitute to any manufacturer covered by patient's insurance.   Lancets Misc. Misc 1 each by Does not apply route in the morning, at noon, and at bedtime. May substitute to any manufacturer covered by patient's insurance.   metFORMIN 500 MG tablet Commonly known as:  GLUCOPHAGE Take 1 tablet (500 mg total) by mouth 2 (two) times daily with a meal.   nitroGLYCERIN 0.4 MG SL tablet Commonly known as: NITROSTAT Place 1 tablet (0.4 mg total) under the tongue every 5 (five) minutes as needed for chest pain. Up to 3 doses per day   pantoprazole 40 MG tablet Commonly known as: PROTONIX Take 1 tablet (40 mg total) by mouth daily at 12 noon.   potassium chloride SA 20 MEQ tablet Commonly known as: KLOR-CON M Take 1 tablet (20 mEq total) by mouth 2 (two) times daily.        Follow-up Information     Mallipeddi, Vishnu P, MD Follow up.   Specialties: Cardiology, Internal Medicine Why: [facility] - [place] location - follow-up appointment scheduled with Dr. Jenene Slicker on Thursday Dec 04, 2022 at 10:40 AM (Arrive by 10:25 AM). Contact information: [address] [place] [address] [phone]                Allergies  Allergen Reactions   Bee Venom Anaphylaxis   Contrast Media [Iodinated Contrast Media] Anaphylaxis   Naproxen Hives    Consultations: Cardiology   Procedures/Studies: ECHOCARDIOGRAM COMPLETE  Result Date: 10/11/2022    ECHOCARDIOGRAM REPORT   Patient Name:   Samuel Santos Date of Exam: 10/11/2022 Medical Rec #:  [id number]   Height:       67.0 in Accession #:    [id number]  Weight:       211.0 lb Date of Birth:  05-13-1951    BSA:          2.069 m Patient Age:    72 years    BP:           176/87 mmHg Patient Gender: M           HR:           61 bpm. [facility] Procedure: 2D Echo, Cardiac Doppler, Color Doppler and Intracardiac            Opacification Agent Indications:    I50.40* Unspecified combined systolic (congestive) and diastolic                 (congestive) heart failure  History:        Patient has no prior history of Echocardiogram examinations.                 CHF, Stroke; Risk Factors:Diabetes and Hypertension.  Sonographer:    Sheralyn Boatman RDCS Referring Phys: [phone] JACOB J STINSON  Sonographer Comments:  Technically difficult study due to poor echo windows, suboptimal parasternal window and suboptimal apical window. "Very difficult study due to patient left arm restricted mobility. Patient talking and moving throughout study. Patient grabbed my hand when doing Definity." IMPRESSIONS  1. Left ventricular ejection fraction, by estimation, is 55 to 60%. The left ventricle has normal function. Left ventricular endocardial border not optimally defined to evaluate regional wall motion. Left ventricular diastolic parameters are consistent with Grade II diastolic dysfunction (pseudonormalization).  2. Right ventricular systolic function is mildly reduced. The right ventricular size  is mild-moderately enlarged. There is severely elevated pulmonary artery systolic pressure. The estimated right ventricular systolic pressure is 66.6 mmHg.  3. The mitral valve is grossly normal. No evidence of mitral valve regurgitation. No evidence of mitral stenosis.  4. The aortic valve is tricuspid. There is mild calcification of the aortic valve. There is mild thickening of the aortic valve. Aortic valve regurgitation is not visualized. No aortic stenosis is present.  5. The inferior vena cava is dilated in size with <50% respiratory variability, suggesting right atrial pressure of 15 mmHg. FINDINGS  Left Ventricle: Left ventricular ejection fraction, by estimation, is 55 to 60%. The left ventricle has normal function. Left ventricular endocardial border not optimally defined to evaluate regional wall motion. Definity contrast agent was given IV to delineate the left ventricular endocardial borders. The left ventricular internal cavity size was normal in size. There is no left ventricular hypertrophy. Left ventricular diastolic parameters are consistent with Grade II diastolic dysfunction (pseudonormalization). Right Ventricle: The right ventricular size is mild-moderately enlarged. No increase in right ventricular wall thickness. Right  ventricular systolic function is mildly reduced. There is severely elevated pulmonary artery systolic pressure. The tricuspid regurgitant velocity is 3.59 m/s, and with an assumed right atrial pressure of 15 mmHg, the estimated right ventricular systolic pressure is 66.6 mmHg. Left Atrium: Left atrial size was normal in size. Right Atrium: Right atrial size was normal in size. Pericardium: There is no evidence of pericardial effusion. Mitral Valve: The mitral valve is grossly normal. No evidence of mitral valve regurgitation. No evidence of mitral valve stenosis. Tricuspid Valve: The tricuspid valve is normal in structure. Tricuspid valve regurgitation is mild . No evidence of tricuspid stenosis. Aortic Valve: The aortic valve is tricuspid. There is mild calcification of the aortic valve. There is mild thickening of the aortic valve. Aortic valve regurgitation is not visualized. No aortic stenosis is present. Pulmonic Valve: The pulmonic valve was not well visualized. Pulmonic valve regurgitation is not visualized. No evidence of pulmonic stenosis. Aorta: The aortic root is normal in size and structure. Ascending aorta measurements are within normal limits for age when indexed to body surface area. Venous: The inferior vena cava is dilated in size with less than 50% respiratory variability, suggesting right atrial pressure of 15 mmHg. IAS/Shunts: No atrial level shunt detected by color flow Doppler.  LEFT VENTRICLE PLAX 2D LVIDd:         5.70 cm   Diastology LVIDs:         4.40 cm   LV e' medial:    4.73 cm/s LV PW:         1.00 cm   LV E/e' medial:  18.0 LV IVS:        1.00 cm   LV e' lateral:   4.88 cm/s LVOT diam:     2.50 cm   LV E/e' lateral: 17.5 LV SV:         85 LV SV Index:   41 LVOT Area:     4.91 cm  RIGHT VENTRICLE            IVC RV S prime:     6.98 cm/s  IVC diam: 2.90 cm TAPSE (M-mode): 2.1 cm LEFT ATRIUM             Index        RIGHT ATRIUM           Index LA diam:        4.30 cm 2.08  cm/m   RA  Area:     16.10 cm LA Vol (A2C):   36.0 ml 17.40 ml/m  RA Volume:   37.90 ml  18.32 ml/m LA Vol (A4C):   38.9 ml 18.80 ml/m LA Biplane Vol: 40.5 ml 19.58 ml/m  AORTIC VALVE LVOT Vmax:   83.10 cm/s LVOT Vmean:  52.700 cm/s LVOT VTI:    0.173 m  AORTA Ao Root diam: 3.50 cm Ao Asc diam:  3.80 cm MITRAL VALVE               TRICUSPID VALVE MV Area (PHT): 3.68 cm    TR Peak grad:   51.6 mmHg MV Decel Time: 206 msec    TR Vmax:        359.00 cm/s MV E velocity: 85.25 cm/s MV A velocity: 52.70 cm/s  SHUNTS MV E/A ratio:  1.62        Systemic VTI:  0.17 m                            Systemic Diam: 2.50 cm Weston Brass MD Electronically signed by Weston Brass MD Signature Date/Time: 10/11/2022/2:47:56 PM    Final    DG Chest Port 1 View  Result Date: 10/10/2022 CLINICAL DATA:  Shortness of breath. EXAM: PORTABLE CHEST 1 VIEW COMPARISON:  02/21/2022. FINDINGS: Rotated patient. Low lung volumes accentuate the pulmonary vasculature and cardiomediastinal silhouette. No consolidation or edema. No pleural effusion or pneumothorax. IMPRESSION: No evidence of acute cardiopulmonary disease. Electronically Signed   By: Orvan Falconer M.D.   On: 10/10/2022 15:30     Discharge Exam: Vitals:   10/13/22 2007 10/14/22 0601  BP: (!) 159/79 (!) 162/85  Pulse: 66 65  Resp: 18 20  Temp:  98.1 F (36.7 C)  SpO2: 98% 97%   Vitals:   10/13/22 1355 10/13/22 2007 10/14/22 0500 10/14/22 0601  BP: (!) 146/71 (!) 159/79  (!) 162/85  Pulse: (!) 59 66  65  Resp:  18  20  Temp: 98 F (36.7 C)   98.1 F (36.7 C)  TempSrc: Oral   Oral  SpO2: 98% 98%  97%  Weight:   94.9 kg   Height:        General: Pt is alert, awake, not in acute distress Cardiovascular: RRR, S1/S2 +, no rubs, no gallops Respiratory: CTA bilaterally, no wheezing, no rhonchi Abdominal: Soft, NT, ND, bowel sounds + Extremities: no edema, no cyanosis    The results of significant diagnostics from this hospitalization (including imaging,  microbiology, ancillary and laboratory) are listed below for reference.     Microbiology: Recent Results (from the past 240 hour(s))  MRSA Next Gen by PCR, Nasal     Status: None   Collection Time: 10/11/22  2:01 AM   Specimen: Nasal Mucosa; Nasal Swab  Result Value Ref Range Status   MRSA by PCR Next Gen NOT DETECTED NOT DETECTED Final    Comment: (NOTE) The GeneXpert MRSA Assay (FDA approved for NASAL specimens only), is one component of a comprehensive MRSA colonization surveillance program. It is not intended to diagnose MRSA infection nor to guide or monitor treatment for MRSA infections. Test performance is not FDA approved in patients less than 52 years old. Performed at [facility], [address] [place], [place] [address]      Labs: BNP (last 3 results) Recent Labs    10/10/22 1515  BNP 1,016.0*   Basic Metabolic Panel:  Recent Labs  Lab 10/10/22 1457 10/11/22 0431 10/12/22 0316 10/13/22 0426 10/14/22 0453  NA 134* 136 135 134* 134*  K 4.9 4.4 4.4 4.4 4.5  CL 101 103 102 102 102  CO2 24 24 24 24 25   GLUCOSE 299* 176* 123* 86 86  BUN 33* 32* 33* 41* 39*  CREATININE 1.45* 1.34* 1.18 1.11 1.15  CALCIUM 8.6* 8.5* 8.4* 8.5* 8.5*  MG  --   --   --   --  2.0   Liver Function Tests: Recent Labs  Lab 10/13/22 0426  AST 17  ALT 21  ALKPHOS 93  BILITOT 1.0  PROT 6.6  ALBUMIN 3.3*   No results for input(s): "LIPASE", "AMYLASE" in the last 168 hours. No results for input(s): "AMMONIA" in the last 168 hours. CBC: Recent Labs  Lab 10/10/22 1457 10/11/22 0431 10/14/22 0453  WBC 7.3 9.6 7.1  HGB 10.8* 10.0* 10.3*  HCT 36.9* 32.9* 34.2*  MCV 86.4 84.4 85.1  PLT 152 174 173   Cardiac Enzymes: No results for input(s): "CKTOTAL", "CKMB", "CKMBINDEX", "TROPONINI" in the last 168 hours. BNP: Invalid input(s): "POCBNP" CBG: Recent Labs  Lab 10/13/22 1111 10/13/22 1539 10/13/22 2012 10/14/22 0720 10/14/22 1110  GLUCAP 97 148* 174* 86 215*    D-Dimer No results for input(s): "DDIMER" in the last 72 hours. Hgb A1c No results for input(s): "HGBA1C" in the last 72 hours. Lipid Profile Recent Labs    10/14/22 0453  CHOL 140  HDL 22*  LDLCALC 99  TRIG 97  CHOLHDL 6.4   Thyroid function studies No results for input(s): "TSH", "T4TOTAL", "T3FREE", "THYROIDAB" in the last 72 hours.  Invalid input(s): "FREET3" Anemia work up No results for input(s): "VITAMINB12", "FOLATE", "FERRITIN", "TIBC", "IRON", "RETICCTPCT" in the last 72 hours. Urinalysis    Component Value Date/Time   COLORURINE YELLOW 04/13/2016 2001   APPEARANCEUR CLEAR 04/13/2016 2001   LABSPEC 1.010 04/13/2016 2001   PHURINE 8.5 (H) 04/13/2016 2001   GLUCOSEU NEGATIVE 04/13/2016 2001   HGBUR NEGATIVE 04/13/2016 2001   BILIRUBINUR NEGATIVE 04/13/2016 2001   KETONESUR NEGATIVE 04/13/2016 2001   PROTEINUR NEGATIVE 04/13/2016 2001   NITRITE NEGATIVE 04/13/2016 2001   LEUKOCYTESUR NEGATIVE 04/13/2016 2001   Sepsis Labs Recent Labs  Lab 10/10/22 1457 10/11/22 0431 10/14/22 0453  WBC 7.3 9.6 7.1   Microbiology Recent Results (from the past 240 hour(s))  MRSA Next Gen by PCR, Nasal     Status: None   Collection Time: 10/11/22  2:01 AM   Specimen: Nasal Mucosa; Nasal Swab  Result Value Ref Range Status   MRSA by PCR Next Gen NOT DETECTED NOT DETECTED Final    Comment: (NOTE) The GeneXpert MRSA Assay (FDA approved for NASAL specimens only), is one component of a comprehensive MRSA colonization surveillance program. It is not intended to diagnose MRSA infection nor to guide or monitor treatment for MRSA infections. Test performance is not FDA approved in patients less than 66 years old. Performed at [facility], [address] [place], [place] [address]      Time coordinating discharge: 35 minutes  SIGNED:   Erick Blinks, DO [facility] 10/14/2022, 11:23 AM  If 7PM-7AM, please contact night-coverage [email or web address]

## 2022-10-14 NOTE — Evaluation (Signed)
Physical Therapy Evaluation Patient Details Name: Samuel Santos MRN: [id number] DOB: 1951/05/07 Today's Date: 10/14/2022  History of Present Illness  Samuel Santos is a 72 y.o. male with medical history significant of diabetes, hypertension, history of stroke.  Patient presents with chest pain that has been intermittent for the past 3 days.  He is also having shortness of breath, which accompanies the chest pain.  He did go to his PCP, who sent him here for evaluation.  Part of his shortness of breath is orthopnea.  His chest pain is described as a constant pressure.  He states that he has been prescribed atenolol which he takes.  He states that his blood pressure is always been difficult to control with that his blood pressure in the 190s systolically is his normally range.   Clinical Impression  Patient functioning near baseline for functional mobility and gait other than requiring increased time, multiple attempts for sitting up at bedside, completing sit to stands mostly due to weakness and not having set up as at home, had to pull self up to sitting and completing sit to stand using bed rail and grab bars in bathroom during to transfer to commode.  Patient tolerated sitting up on commode in bathroom to attempt bowel movement - nurse notified.  PLAN:  Patient to be discharged home today and discharged from acute physical therapy to care of nursing for ambulation as tolerated for length of stay with recommendations stated below         Recommendations for follow up therapy are one component of a multi-disciplinary discharge planning process, led by the attending physician.  Recommendations may be updated based on patient status, additional functional criteria and insurance authorization.  Follow Up Recommendations       Assistance Recommended at Discharge Set up Supervision/Assistance  Patient can return home with the following  A little help with walking and/or transfers;A little help with  bathing/dressing/bathroom;Help with stairs or ramp for entrance;Assistance with cooking/housework    Equipment Recommendations None recommended by PT  Recommendations for Other Services       Functional Status Assessment Patient has had a recent decline in their functional status and demonstrates the ability to make significant improvements in function in a reasonable and predictable amount of time.     Precautions / Restrictions Precautions Precautions: Fall Restrictions Weight Bearing Restrictions: No      Mobility  Bed Mobility Overal bed mobility: Needs Assistance Bed Mobility: Supine to Sit     Supine to sit: Supervision     General bed mobility comments: increased time, frequent rest breaks, had to pull self to sitting using bed rail    Transfers Overall transfer level: Needs assistance Equipment used: Hemi-walker Transfers: Sit to/from Stand, Bed to chair/wheelchair/BSC Sit to Stand: Min guard   Step pivot transfers: Min guard       General transfer comment: increased time with repeated attempts before able to complete sit to stand    Ambulation/Gait Ambulation/Gait assistance: Supervision, Modified independent (Device/Increase time) Gait Distance (Feet): 45 Feet Assistive device: Hemi-walker, Rolling walker (2 wheels) Gait Pattern/deviations: Decreased step length - right, Decreased step length - left, Decreased stride length, Antalgic, Decreased stance time - left Gait velocity: decreased     General Gait Details: demonstrates good return for using Hemi-walker with slow slightly labored cadence with mostly step-to pattern without loss of balance  Stairs            Wheelchair Mobility    Modified Rankin (Stroke Patients  Only)       Balance Overall balance assessment: Needs assistance Sitting-balance support: Feet supported, No upper extremity supported Sitting balance-Leahy Scale: Good Sitting balance - Comments: seated at EOB   Standing  balance support: During functional activity, Single extremity supported Standing balance-Leahy Scale: Fair Standing balance comment: fair/good using Hemi-walker                             Pertinent Vitals/Pain Pain Assessment Pain Assessment: No/denies pain    Home Living Family/patient expects to be discharged to:: Private residence Living Arrangements: Alone Available Help at Discharge: Family;Available PRN/intermittently Type of Home: Mobile home Home Access: Stairs to enter Entrance Stairs-Rails: Left Entrance Stairs-Number of Steps: 2   Home Layout: One level Home Equipment: Art gallery manager;Other (comment) (Hemi-walker) Additional Comments: uses Hemi-walker    Prior Function Prior Level of Function : Independent/Modified Independent;Driving             Mobility Comments: household and short distanced Teacher, music, uses electric scooter for longer distances ADLs Comments: Indpendent, family checks on him daily     Hand Dominance   Dominant Hand: Right    Extremity/Trunk Assessment   Upper Extremity Assessment Upper Extremity Assessment: Generalized weakness;RUE deficits/detail;LUE deficits/detail RUE Deficits / Details: grossly 4/5 RUE Sensation: WNL RUE Coordination: WNL LUE Deficits / Details: grossly 0/5, contractures, non-functional LUE: Unable to fully assess due to immobilization LUE Sensation: decreased light touch;decreased proprioception LUE Coordination: decreased fine motor;decreased gross motor    Lower Extremity Assessment Lower Extremity Assessment: Generalized weakness;RLE deficits/detail;LLE deficits/detail RLE Deficits / Details: grossly 4/5 RLE Sensation: WNL RLE Coordination: WNL LLE Deficits / Details: grossly -4/5 LLE Sensation: decreased proprioception;decreased light touch LLE Coordination: decreased fine motor;decreased gross motor       Communication   Communication: No difficulties   Cognition Arousal/Alertness: Awake/alert Behavior During Therapy: WFL for tasks assessed/performed Overall Cognitive Status: Within Functional Limits for tasks assessed                                          General Comments      Exercises     Assessment/Plan    PT Assessment All further PT needs can be met in the next venue of care  PT Problem List Decreased strength;Decreased activity tolerance;Decreased balance;Decreased mobility       PT Treatment Interventions      PT Goals (Current goals can be found in the Care Plan section)  Acute Rehab PT Goals Patient Stated Goal: return home PT Goal Formulation: With patient Time For Goal Achievement: 10/14/22 Potential to Achieve Goals: Good    Frequency       Co-evaluation               AM-PAC PT "6 Clicks" Mobility  Outcome Measure Help needed turning from your back to your side while in a flat bed without using bedrails?: None Help needed moving from lying on your back to sitting on the side of a flat bed without using bedrails?: A Little Help needed moving to and from a bed to a chair (including a wheelchair)?: A Little Help needed standing up from a chair using your arms (e.g., wheelchair or bedside chair)?: A Little Help needed to walk in hospital room?: None Help needed climbing 3-5 steps with a railing? : A Little 6 Click  Score: 20    End of Session   Activity Tolerance: Patient tolerated treatment well;Patient limited by fatigue Patient left: with call bell/phone within reach;Other (comment) (left seated on commode in bathroom) Nurse Communication: Mobility status PT Visit Diagnosis: Unsteadiness on feet (R26.81);Other abnormalities of gait and mobility (R26.89);Muscle weakness (generalized) (M62.81)    Time: 9604-5409 PT Time Calculation (min) (ACUTE ONLY): 30 min   Charges:   PT Evaluation $PT Eval Moderate Complexity: 1 Mod PT Treatments $Therapeutic Activity: 23-37 mins         12:18 PM, 10/14/22 Ocie Bob, MPT Physical Therapist with [facility] 336 [phone] office [phone] mobile phone

## 2022-10-14 NOTE — Progress Notes (Signed)
Patient refused insulin and protonix. States "the hospital just wants to charge him more money and [employer] is in on it too so I aint taking those meds"

## 2022-10-16 ENCOUNTER — Telehealth: Payer: Self-pay | Admitting: *Deleted

## 2022-10-16 NOTE — Progress Notes (Signed)
  Care Coordination  Outreach Note  10/16/2022 Name: Samuel Santos MRN: [id number] DOB: [birth year]   Care Coordination Outreach Attempts: An unsuccessful telephone outreach was attempted today to offer the patient information about available care coordination services from referral.   Follow Up Plan:  Additional outreach attempts will be made to offer the patient care coordination information and services.   Encounter Outcome:  No Answer  Christie Nottingham  Care Coordination Care Guide  Direct Dial: [phone]

## 2022-10-17 NOTE — Progress Notes (Unsigned)
  Care Coordination  Outreach Note  10/17/2022 Name: Samuel Santos MRN: [id number] DOB: 12/09/1950   Care Coordination Outreach Attempts: A second unsuccessful outreach was attempted today to offer the patient with information about available care coordination services as a benefit of their health plan.     Follow Up Plan:  Additional outreach attempts will be made to offer the patient care coordination information and services.   Encounter Outcome:  No Answer  Christie Nottingham  Care Coordination Care Guide  Direct Dial: [phone]

## 2022-10-20 NOTE — Progress Notes (Signed)
  Care Coordination  Outreach Note  10/20/2022 Name: Samuel Santos MRN: [id number] DOB: 07/09/1950   Care Coordination Outreach Attempts: A third unsuccessful outreach was attempted today to offer the patient with information about available care coordination services as a benefit of their health plan.   Follow Up Plan:  No further outreach attempts will be made at this time. We have been unable to contact the patient to offer or enroll patient in care coordination services  Encounter Outcome:  No Answer  Gwenevere Ghazi  Care Coordination Care Guide  Direct Dial: [phone]

## 2022-10-30 DIAGNOSIS — R1311 Dysphagia, oral phase: Secondary | ICD-10-CM | POA: Diagnosis not present

## 2022-10-30 DIAGNOSIS — J9602 Acute respiratory failure with hypercapnia: Secondary | ICD-10-CM | POA: Diagnosis not present

## 2022-10-30 DIAGNOSIS — I639 Cerebral infarction, unspecified: Secondary | ICD-10-CM | POA: Diagnosis not present

## 2022-11-13 ENCOUNTER — Other Ambulatory Visit: Payer: Medicare PPO

## 2022-11-13 ENCOUNTER — Ambulatory Visit: Payer: Medicare PPO

## 2022-11-13 ENCOUNTER — Telehealth: Payer: Self-pay | Admitting: Internal Medicine

## 2022-11-13 DIAGNOSIS — I5030 Unspecified diastolic (congestive) heart failure: Secondary | ICD-10-CM | POA: Diagnosis not present

## 2022-11-13 DIAGNOSIS — E119 Type 2 diabetes mellitus without complications: Secondary | ICD-10-CM | POA: Diagnosis not present

## 2022-11-13 DIAGNOSIS — I6930 Unspecified sequelae of cerebral infarction: Secondary | ICD-10-CM | POA: Diagnosis not present

## 2022-11-13 DIAGNOSIS — I10 Essential (primary) hypertension: Secondary | ICD-10-CM | POA: Diagnosis not present

## 2022-11-18 NOTE — Telephone Encounter (Signed)
ERROR

## 2022-11-19 ENCOUNTER — Encounter (HOSPITAL_COMMUNITY): Payer: Medicare PPO

## 2022-11-30 ENCOUNTER — Emergency Department (HOSPITAL_COMMUNITY): Payer: Medicare PPO

## 2022-11-30 ENCOUNTER — Emergency Department (HOSPITAL_COMMUNITY)
Admission: EM | Admit: 2022-11-30 | Discharge: 2022-11-30 | Disposition: A | Discharge status: Home / Self Care | Payer: Medicare PPO | Attending: Emergency Medicine | Admitting: Emergency Medicine

## 2022-11-30 ENCOUNTER — Encounter (HOSPITAL_COMMUNITY): Payer: Self-pay

## 2022-11-30 ENCOUNTER — Other Ambulatory Visit: Payer: Self-pay

## 2022-11-30 DIAGNOSIS — I509 Heart failure, unspecified: Secondary | ICD-10-CM | POA: Diagnosis not present

## 2022-11-30 DIAGNOSIS — S299XXA Unspecified injury of thorax, initial encounter: Secondary | ICD-10-CM | POA: Diagnosis not present

## 2022-11-30 DIAGNOSIS — S2242XA Multiple fractures of ribs, left side, initial encounter for closed fracture: Secondary | ICD-10-CM | POA: Diagnosis not present

## 2022-11-30 DIAGNOSIS — E119 Type 2 diabetes mellitus without complications: Secondary | ICD-10-CM | POA: Diagnosis not present

## 2022-11-30 DIAGNOSIS — Z79899 Other long term (current) drug therapy: Secondary | ICD-10-CM | POA: Diagnosis not present

## 2022-11-30 DIAGNOSIS — R0789 Other chest pain: Secondary | ICD-10-CM | POA: Diagnosis not present

## 2022-11-30 DIAGNOSIS — R1311 Dysphagia, oral phase: Secondary | ICD-10-CM | POA: Diagnosis not present

## 2022-11-30 DIAGNOSIS — I110 Hypertensive heart disease with heart failure: Secondary | ICD-10-CM | POA: Diagnosis not present

## 2022-11-30 DIAGNOSIS — Z7984 Long term (current) use of oral hypoglycemic drugs: Secondary | ICD-10-CM | POA: Diagnosis not present

## 2022-11-30 DIAGNOSIS — J9602 Acute respiratory failure with hypercapnia: Secondary | ICD-10-CM | POA: Diagnosis not present

## 2022-11-30 DIAGNOSIS — I639 Cerebral infarction, unspecified: Secondary | ICD-10-CM | POA: Diagnosis not present

## 2022-11-30 MED ORDER — OXYCODONE-ACETAMINOPHEN 5-325 MG PO TABS
1.0000 | ORAL_TABLET | Freq: Once | ORAL | Status: AC
  Administered 2022-11-30: 1 via ORAL
  Filled 2022-11-30: qty 1

## 2022-11-30 MED ORDER — OXYCODONE-ACETAMINOPHEN 5-325 MG PO TABS: 1.0000 | ORAL_TABLET | ORAL | 0 refills | Status: AC | PRN

## 2022-11-30 NOTE — ED Notes (Signed)
Patient given Incentive , patient upset about many things.
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
## 2022-11-30 NOTE — Discharge Instructions (Addendum)
Your x-ray this evening shows that you have 3 broken ribs on the left.  There is no punctured lung.  I recommend that you use the spirometer 3-4 times daily.  Use a towel or pillow held to your side to cough or take deep breaths.  Please follow-up with your primary care provider for recheck, return emergency department for any new or worsening symptoms.

## 2022-11-30 NOTE — ED Notes (Signed)
RT called to bring incentive spirometer.  
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
## 2022-12-04 ENCOUNTER — Ambulatory Visit: Payer: Medicare PPO | Attending: Internal Medicine | Admitting: Internal Medicine

## 2022-12-04 NOTE — Progress Notes (Signed)
Erroneous encounter - please disregard.

## 2022-12-30 DIAGNOSIS — R1311 Dysphagia, oral phase: Secondary | ICD-10-CM | POA: Diagnosis not present

## 2022-12-30 DIAGNOSIS — J9602 Acute respiratory failure with hypercapnia: Secondary | ICD-10-CM | POA: Diagnosis not present

## 2022-12-30 DIAGNOSIS — I639 Cerebral infarction, unspecified: Secondary | ICD-10-CM | POA: Diagnosis not present

## 2023-01-06 DIAGNOSIS — I6930 Unspecified sequelae of cerebral infarction: Secondary | ICD-10-CM | POA: Diagnosis not present

## 2023-01-06 DIAGNOSIS — Z13220 Encounter for screening for lipoid disorders: Secondary | ICD-10-CM | POA: Diagnosis not present

## 2023-01-06 DIAGNOSIS — I5030 Unspecified diastolic (congestive) heart failure: Secondary | ICD-10-CM | POA: Diagnosis not present

## 2023-01-06 DIAGNOSIS — Z1389 Encounter for screening for other disorder: Secondary | ICD-10-CM | POA: Diagnosis not present

## 2023-01-06 DIAGNOSIS — Z6828 Body mass index (BMI) 28.0-28.9, adult: Secondary | ICD-10-CM | POA: Diagnosis not present

## 2023-01-06 DIAGNOSIS — Z1331 Encounter for screening for depression: Secondary | ICD-10-CM | POA: Diagnosis not present

## 2023-01-06 DIAGNOSIS — I10 Essential (primary) hypertension: Secondary | ICD-10-CM | POA: Diagnosis not present

## 2023-01-06 DIAGNOSIS — E119 Type 2 diabetes mellitus without complications: Secondary | ICD-10-CM | POA: Diagnosis not present

## 2023-03-02 DIAGNOSIS — J9602 Acute respiratory failure with hypercapnia: Secondary | ICD-10-CM | POA: Diagnosis not present

## 2023-04-01 DIAGNOSIS — R1311 Dysphagia, oral phase: Secondary | ICD-10-CM | POA: Diagnosis not present

## 2023-04-01 DIAGNOSIS — J9602 Acute respiratory failure with hypercapnia: Secondary | ICD-10-CM | POA: Diagnosis not present

## 2023-04-01 DIAGNOSIS — I639 Cerebral infarction, unspecified: Secondary | ICD-10-CM | POA: Diagnosis not present

## 2023-04-05 DIAGNOSIS — S065X0A Traumatic subdural hemorrhage without loss of consciousness, initial encounter: Secondary | ICD-10-CM | POA: Diagnosis not present

## 2023-04-05 DIAGNOSIS — R410 Disorientation, unspecified: Secondary | ICD-10-CM | POA: Diagnosis not present

## 2023-04-05 DIAGNOSIS — S06329A Contusion and laceration of left cerebrum with loss of consciousness of unspecified duration, initial encounter: Secondary | ICD-10-CM | POA: Diagnosis not present

## 2023-04-05 DIAGNOSIS — R079 Chest pain, unspecified: Secondary | ICD-10-CM | POA: Diagnosis not present

## 2023-04-05 DIAGNOSIS — G4489 Other headache syndrome: Secondary | ICD-10-CM | POA: Diagnosis not present

## 2023-04-05 DIAGNOSIS — R58 Hemorrhage, not elsewhere classified: Secondary | ICD-10-CM | POA: Diagnosis not present

## 2023-04-05 DIAGNOSIS — M62422 Contracture of muscle, left upper arm: Secondary | ICD-10-CM | POA: Diagnosis not present

## 2023-04-05 DIAGNOSIS — S0990XA Unspecified injury of head, initial encounter: Secondary | ICD-10-CM | POA: Diagnosis not present

## 2023-04-05 DIAGNOSIS — Z23 Encounter for immunization: Secondary | ICD-10-CM | POA: Diagnosis not present

## 2023-04-05 DIAGNOSIS — S066X9A Traumatic subarachnoid hemorrhage with loss of consciousness of unspecified duration, initial encounter: Secondary | ICD-10-CM | POA: Diagnosis not present

## 2023-04-05 DIAGNOSIS — S066X0A Traumatic subarachnoid hemorrhage without loss of consciousness, initial encounter: Secondary | ICD-10-CM | POA: Diagnosis not present

## 2023-04-05 DIAGNOSIS — S06360A Traumatic hemorrhage of cerebrum, unspecified, without loss of consciousness, initial encounter: Secondary | ICD-10-CM | POA: Diagnosis not present

## 2023-04-05 DIAGNOSIS — Z87891 Personal history of nicotine dependence: Secondary | ICD-10-CM | POA: Diagnosis not present

## 2023-04-05 DIAGNOSIS — S199XXA Unspecified injury of neck, initial encounter: Secondary | ICD-10-CM | POA: Diagnosis not present

## 2023-04-05 DIAGNOSIS — W1830XA Fall on same level, unspecified, initial encounter: Secondary | ICD-10-CM | POA: Diagnosis not present

## 2023-04-05 DIAGNOSIS — S066XAA Traumatic subarachnoid hemorrhage with loss of consciousness status unknown, initial encounter: Secondary | ICD-10-CM | POA: Diagnosis not present

## 2023-04-05 DIAGNOSIS — S0512XA Contusion of eyeball and orbital tissues, left eye, initial encounter: Secondary | ICD-10-CM | POA: Diagnosis not present

## 2023-04-05 DIAGNOSIS — E669 Obesity, unspecified: Secondary | ICD-10-CM | POA: Diagnosis not present

## 2023-04-05 DIAGNOSIS — E785 Hyperlipidemia, unspecified: Secondary | ICD-10-CM | POA: Diagnosis not present

## 2023-04-05 DIAGNOSIS — W19XXXA Unspecified fall, initial encounter: Secondary | ICD-10-CM | POA: Diagnosis not present

## 2023-04-05 DIAGNOSIS — I69998 Other sequelae following unspecified cerebrovascular disease: Secondary | ICD-10-CM | POA: Diagnosis not present

## 2023-04-05 DIAGNOSIS — I69354 Hemiplegia and hemiparesis following cerebral infarction affecting left non-dominant side: Secondary | ICD-10-CM | POA: Diagnosis not present

## 2023-04-05 DIAGNOSIS — S0012XA Contusion of left eyelid and periocular area, initial encounter: Secondary | ICD-10-CM | POA: Diagnosis not present

## 2023-04-05 DIAGNOSIS — R0902 Hypoxemia: Secondary | ICD-10-CM | POA: Diagnosis not present

## 2023-04-05 DIAGNOSIS — F0390 Unspecified dementia without behavioral disturbance: Secondary | ICD-10-CM | POA: Diagnosis not present

## 2023-04-05 DIAGNOSIS — S065XAA Traumatic subdural hemorrhage with loss of consciousness status unknown, initial encounter: Secondary | ICD-10-CM | POA: Diagnosis not present

## 2023-04-05 DIAGNOSIS — E119 Type 2 diabetes mellitus without complications: Secondary | ICD-10-CM | POA: Diagnosis not present

## 2023-04-05 DIAGNOSIS — I639 Cerebral infarction, unspecified: Secondary | ICD-10-CM | POA: Diagnosis not present

## 2023-04-05 DIAGNOSIS — S0083XA Contusion of other part of head, initial encounter: Secondary | ICD-10-CM | POA: Diagnosis not present

## 2023-04-05 DIAGNOSIS — I10 Essential (primary) hypertension: Secondary | ICD-10-CM | POA: Diagnosis not present

## 2023-04-05 DIAGNOSIS — R0789 Other chest pain: Secondary | ICD-10-CM | POA: Diagnosis not present

## 2023-04-05 DIAGNOSIS — Z6833 Body mass index (BMI) 33.0-33.9, adult: Secondary | ICD-10-CM | POA: Diagnosis not present

## 2023-04-05 DIAGNOSIS — S01112A Laceration without foreign body of left eyelid and periocular area, initial encounter: Secondary | ICD-10-CM | POA: Diagnosis not present

## 2023-04-05 DIAGNOSIS — Z8673 Personal history of transient ischemic attack (TIA), and cerebral infarction without residual deficits: Secondary | ICD-10-CM | POA: Diagnosis not present

## 2023-05-07 DIAGNOSIS — I10 Essential (primary) hypertension: Secondary | ICD-10-CM | POA: Diagnosis not present

## 2023-05-07 DIAGNOSIS — E119 Type 2 diabetes mellitus without complications: Secondary | ICD-10-CM | POA: Diagnosis not present

## 2023-05-07 DIAGNOSIS — S06330D Contusion and laceration of cerebrum, unspecified, without loss of consciousness, subsequent encounter: Secondary | ICD-10-CM | POA: Diagnosis not present

## 2023-05-07 DIAGNOSIS — I5030 Unspecified diastolic (congestive) heart failure: Secondary | ICD-10-CM | POA: Diagnosis not present

## 2023-05-07 DIAGNOSIS — Z6827 Body mass index (BMI) 27.0-27.9, adult: Secondary | ICD-10-CM | POA: Diagnosis not present

## 2023-05-07 DIAGNOSIS — I6930 Unspecified sequelae of cerebral infarction: Secondary | ICD-10-CM | POA: Diagnosis not present

## 2023-05-12 DIAGNOSIS — Z0001 Encounter for general adult medical examination with abnormal findings: Secondary | ICD-10-CM | POA: Diagnosis not present

## 2023-05-12 DIAGNOSIS — Z6828 Body mass index (BMI) 28.0-28.9, adult: Secondary | ICD-10-CM | POA: Diagnosis not present

## 2023-05-12 DIAGNOSIS — I10 Essential (primary) hypertension: Secondary | ICD-10-CM | POA: Diagnosis not present

## 2023-05-12 DIAGNOSIS — I509 Heart failure, unspecified: Secondary | ICD-10-CM | POA: Diagnosis not present

## 2023-05-14 DIAGNOSIS — I5030 Unspecified diastolic (congestive) heart failure: Secondary | ICD-10-CM | POA: Diagnosis not present

## 2023-05-14 DIAGNOSIS — E119 Type 2 diabetes mellitus without complications: Secondary | ICD-10-CM | POA: Diagnosis not present

## 2023-05-14 DIAGNOSIS — Z6829 Body mass index (BMI) 29.0-29.9, adult: Secondary | ICD-10-CM | POA: Diagnosis not present

## 2023-05-14 DIAGNOSIS — I10 Essential (primary) hypertension: Secondary | ICD-10-CM | POA: Diagnosis not present

## 2023-05-14 DIAGNOSIS — I6930 Unspecified sequelae of cerebral infarction: Secondary | ICD-10-CM | POA: Diagnosis not present

## 2023-09-17 DIAGNOSIS — Z6829 Body mass index (BMI) 29.0-29.9, adult: Secondary | ICD-10-CM | POA: Diagnosis not present

## 2023-09-17 DIAGNOSIS — E119 Type 2 diabetes mellitus without complications: Secondary | ICD-10-CM | POA: Diagnosis not present

## 2023-09-17 DIAGNOSIS — I509 Heart failure, unspecified: Secondary | ICD-10-CM | POA: Diagnosis not present

## 2023-09-17 DIAGNOSIS — I10 Essential (primary) hypertension: Secondary | ICD-10-CM | POA: Diagnosis not present

## 2024-02-19 DIAGNOSIS — E119 Type 2 diabetes mellitus without complications: Secondary | ICD-10-CM | POA: Diagnosis not present

## 2024-02-19 DIAGNOSIS — I5030 Unspecified diastolic (congestive) heart failure: Secondary | ICD-10-CM | POA: Diagnosis not present

## 2024-02-19 DIAGNOSIS — I10 Essential (primary) hypertension: Secondary | ICD-10-CM | POA: Diagnosis not present

## 2024-03-22 DIAGNOSIS — I5030 Unspecified diastolic (congestive) heart failure: Secondary | ICD-10-CM | POA: Diagnosis not present

## 2024-03-22 DIAGNOSIS — E119 Type 2 diabetes mellitus without complications: Secondary | ICD-10-CM | POA: Diagnosis not present

## 2024-03-22 DIAGNOSIS — I10 Essential (primary) hypertension: Secondary | ICD-10-CM | POA: Diagnosis not present

## 2024-04-21 DIAGNOSIS — E119 Type 2 diabetes mellitus without complications: Secondary | ICD-10-CM | POA: Diagnosis not present

## 2024-04-22 DIAGNOSIS — I10 Essential (primary) hypertension: Secondary | ICD-10-CM | POA: Diagnosis not present

## 2024-04-22 DIAGNOSIS — I5030 Unspecified diastolic (congestive) heart failure: Secondary | ICD-10-CM | POA: Diagnosis not present

## 2024-04-22 DIAGNOSIS — E119 Type 2 diabetes mellitus without complications: Secondary | ICD-10-CM | POA: Diagnosis not present

## 2024-04-28 DIAGNOSIS — E119 Type 2 diabetes mellitus without complications: Secondary | ICD-10-CM | POA: Diagnosis not present

## 2024-04-28 DIAGNOSIS — I10 Essential (primary) hypertension: Secondary | ICD-10-CM | POA: Diagnosis not present

## 2024-04-28 DIAGNOSIS — I5030 Unspecified diastolic (congestive) heart failure: Secondary | ICD-10-CM | POA: Diagnosis not present

## 2024-04-28 DIAGNOSIS — Z6830 Body mass index (BMI) 30.0-30.9, adult: Secondary | ICD-10-CM | POA: Diagnosis not present
